# Patient Record
Sex: Male | Born: 1955 | Race: White | Hispanic: No | Marital: Married | Smoking: Former smoker
Health system: Southern US, Community
[De-identification: ages and names within clinical notes are randomized; demographics above are authoritative.]

## PROBLEM LIST (undated history)

## (undated) DIAGNOSIS — R195 Other fecal abnormalities: Secondary | ICD-10-CM

## (undated) DIAGNOSIS — M1990 Unspecified osteoarthritis, unspecified site: Secondary | ICD-10-CM

## (undated) DIAGNOSIS — H8110 Benign paroxysmal vertigo, unspecified ear: Secondary | ICD-10-CM

## (undated) DIAGNOSIS — Z8489 Family history of other specified conditions: Secondary | ICD-10-CM

## (undated) DIAGNOSIS — T7840XA Allergy, unspecified, initial encounter: Secondary | ICD-10-CM

## (undated) DIAGNOSIS — R030 Elevated blood-pressure reading, without diagnosis of hypertension: Secondary | ICD-10-CM

## (undated) DIAGNOSIS — N529 Male erectile dysfunction, unspecified: Secondary | ICD-10-CM

## (undated) DIAGNOSIS — Z9079 Acquired absence of other genital organ(s): Secondary | ICD-10-CM

## (undated) HISTORY — PX: EYE SURGERY: SHX253

## (undated) HISTORY — DX: Gilbert syndrome: E80.4

## (undated) HISTORY — DX: Elevated blood-pressure reading, without diagnosis of hypertension: R03.0

## (undated) HISTORY — DX: Benign paroxysmal vertigo, unspecified ear: H81.10

## (undated) HISTORY — PX: VASECTOMY: SHX75

## (undated) HISTORY — PX: APPENDECTOMY: SHX54

## (undated) HISTORY — DX: Allergy, unspecified, initial encounter: T78.40XA

## (undated) HISTORY — DX: Other fecal abnormalities: R19.5

## (undated) HISTORY — DX: Male erectile dysfunction, unspecified: N52.9

---

## 1978-06-20 HISTORY — PX: TREATMENT FISTULA ANAL: SUR1390

## 2007-04-24 ENCOUNTER — Ambulatory Visit: Payer: Self-pay | Admitting: Gastroenterology

## 2007-04-24 HISTORY — PX: COLONOSCOPY: SHX174

## 2012-09-10 ENCOUNTER — Ambulatory Visit: Payer: BC Managed Care – PPO

## 2012-09-10 ENCOUNTER — Encounter: Payer: Self-pay | Admitting: Family Medicine

## 2012-09-10 ENCOUNTER — Ambulatory Visit (INDEPENDENT_AMBULATORY_CARE_PROVIDER_SITE_OTHER): Payer: BC Managed Care – PPO | Admitting: Family Medicine

## 2012-09-10 VITALS — BP 138/72 | HR 65 | Temp 98.2°F | Resp 16 | Ht 77.0 in | Wt 222.0 lb

## 2012-09-10 DIAGNOSIS — Z0000 Encounter for general adult medical examination without abnormal findings: Secondary | ICD-10-CM

## 2012-09-10 DIAGNOSIS — M25512 Pain in left shoulder: Secondary | ICD-10-CM

## 2012-09-10 DIAGNOSIS — Z8042 Family history of malignant neoplasm of prostate: Secondary | ICD-10-CM | POA: Insufficient documentation

## 2012-09-10 DIAGNOSIS — J309 Allergic rhinitis, unspecified: Secondary | ICD-10-CM

## 2012-09-10 DIAGNOSIS — I781 Nevus, non-neoplastic: Secondary | ICD-10-CM | POA: Insufficient documentation

## 2012-09-10 DIAGNOSIS — M25519 Pain in unspecified shoulder: Secondary | ICD-10-CM | POA: Insufficient documentation

## 2012-09-10 LAB — TSH: TSH: 1.939 u[IU]/mL (ref 0.350–4.500)

## 2012-09-10 LAB — CBC WITH DIFFERENTIAL/PLATELET
Eosinophils Relative: 2 % (ref 0–5)
HCT: 46.5 % (ref 39.0–52.0)
Hemoglobin: 16.6 g/dL (ref 13.0–17.0)
Lymphocytes Relative: 32 % (ref 12–46)
Lymphs Abs: 1.6 10*3/uL (ref 0.7–4.0)
MCV: 84.2 fL (ref 78.0–100.0)
Monocytes Absolute: 0.4 10*3/uL (ref 0.1–1.0)
Monocytes Relative: 8 % (ref 3–12)
RBC: 5.52 MIL/uL (ref 4.22–5.81)
WBC: 5.1 10*3/uL (ref 4.0–10.5)

## 2012-09-10 LAB — POCT URINALYSIS DIPSTICK
Blood, UA: NEGATIVE
Glucose, UA: NEGATIVE
Ketones, UA: NEGATIVE
Spec Grav, UA: 1.01

## 2012-09-10 LAB — COMPREHENSIVE METABOLIC PANEL
ALT: 20 U/L (ref 0–53)
BUN: 20 mg/dL (ref 6–23)
CO2: 28 mEq/L (ref 19–32)
Calcium: 9.9 mg/dL (ref 8.4–10.5)
Chloride: 103 mEq/L (ref 96–112)
Creat: 1.04 mg/dL (ref 0.50–1.35)
Glucose, Bld: 97 mg/dL (ref 70–99)

## 2012-09-10 LAB — LIPID PANEL
Cholesterol: 254 mg/dL — ABNORMAL HIGH (ref 0–200)
VLDL: 16 mg/dL (ref 0–40)

## 2012-09-10 LAB — FOLATE: Folate: >20 ng/mL

## 2012-09-10 IMAGING — CR DG SHOULDER 2+V*L*
2 series · 2 of 2 positions shown · non-contrast
Comparison: None.

CLINICAL DATA: Left shoulder pain.

LEFT SHOULDER - 2+ VIEW

[AP (1 of 2)]
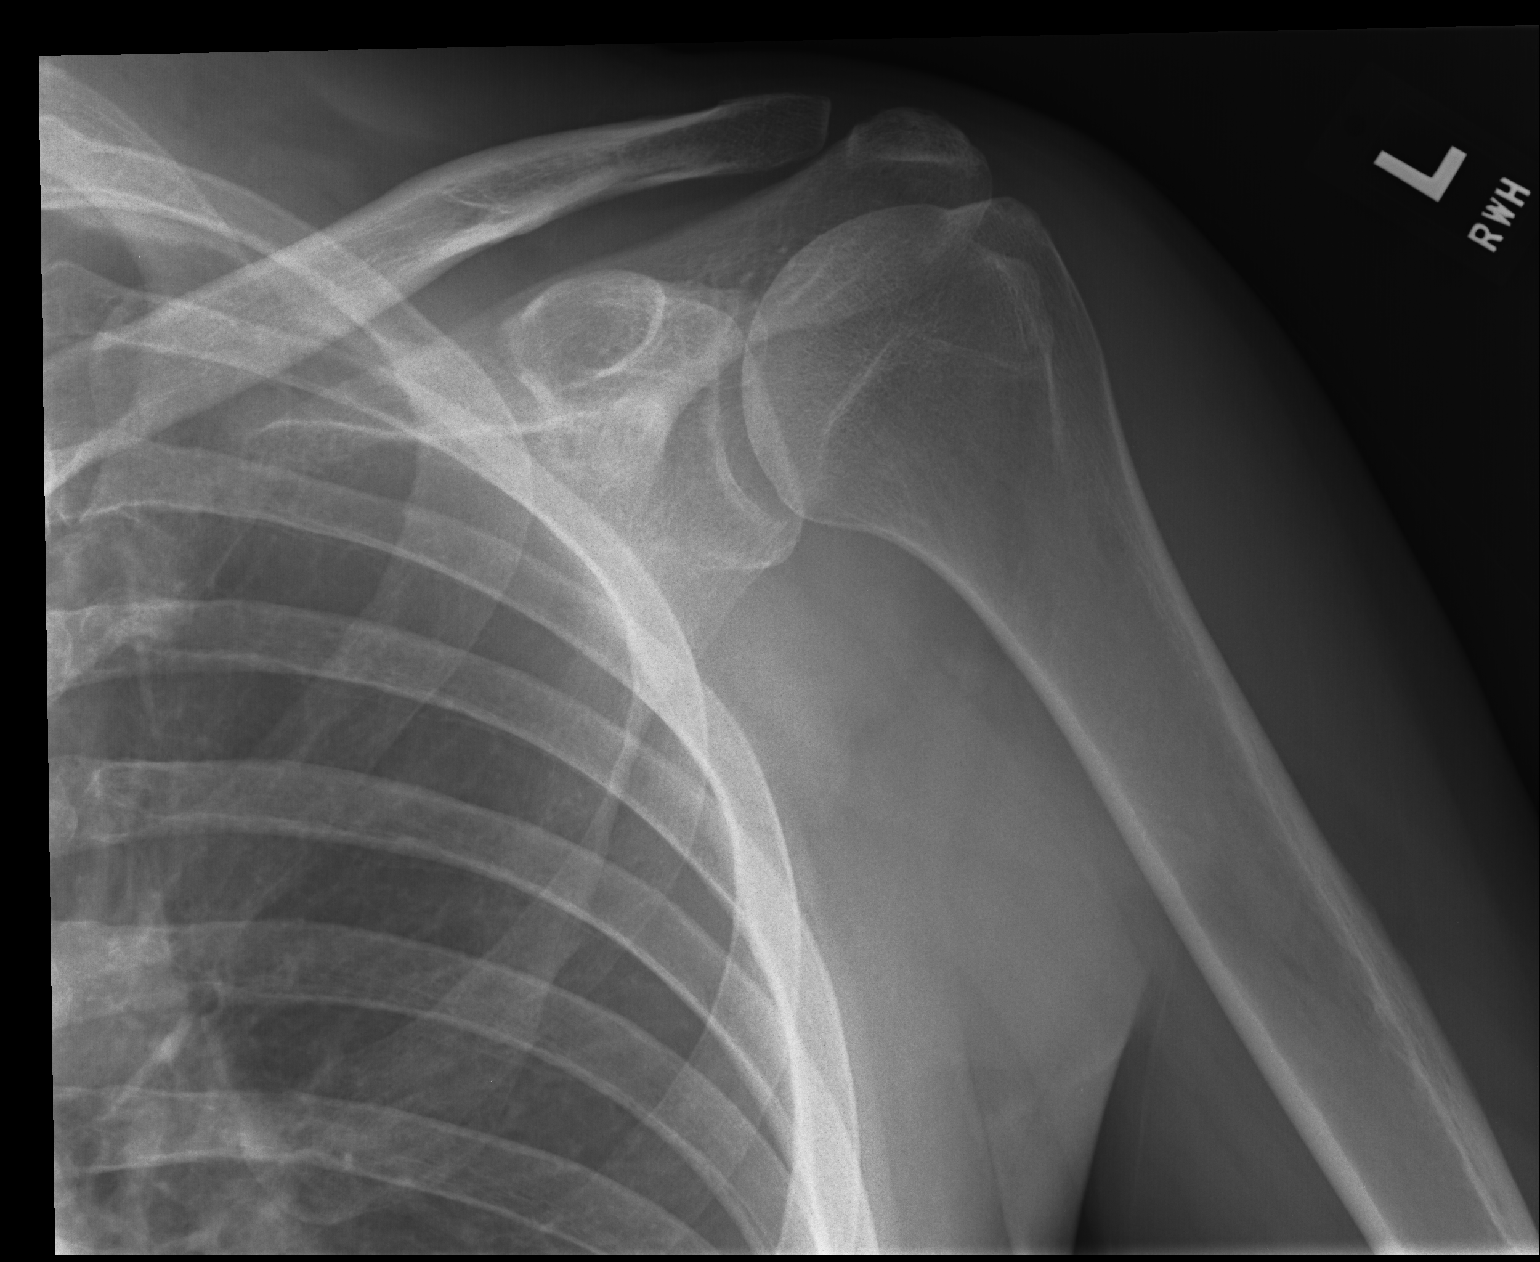

[AP (2 of 2)]
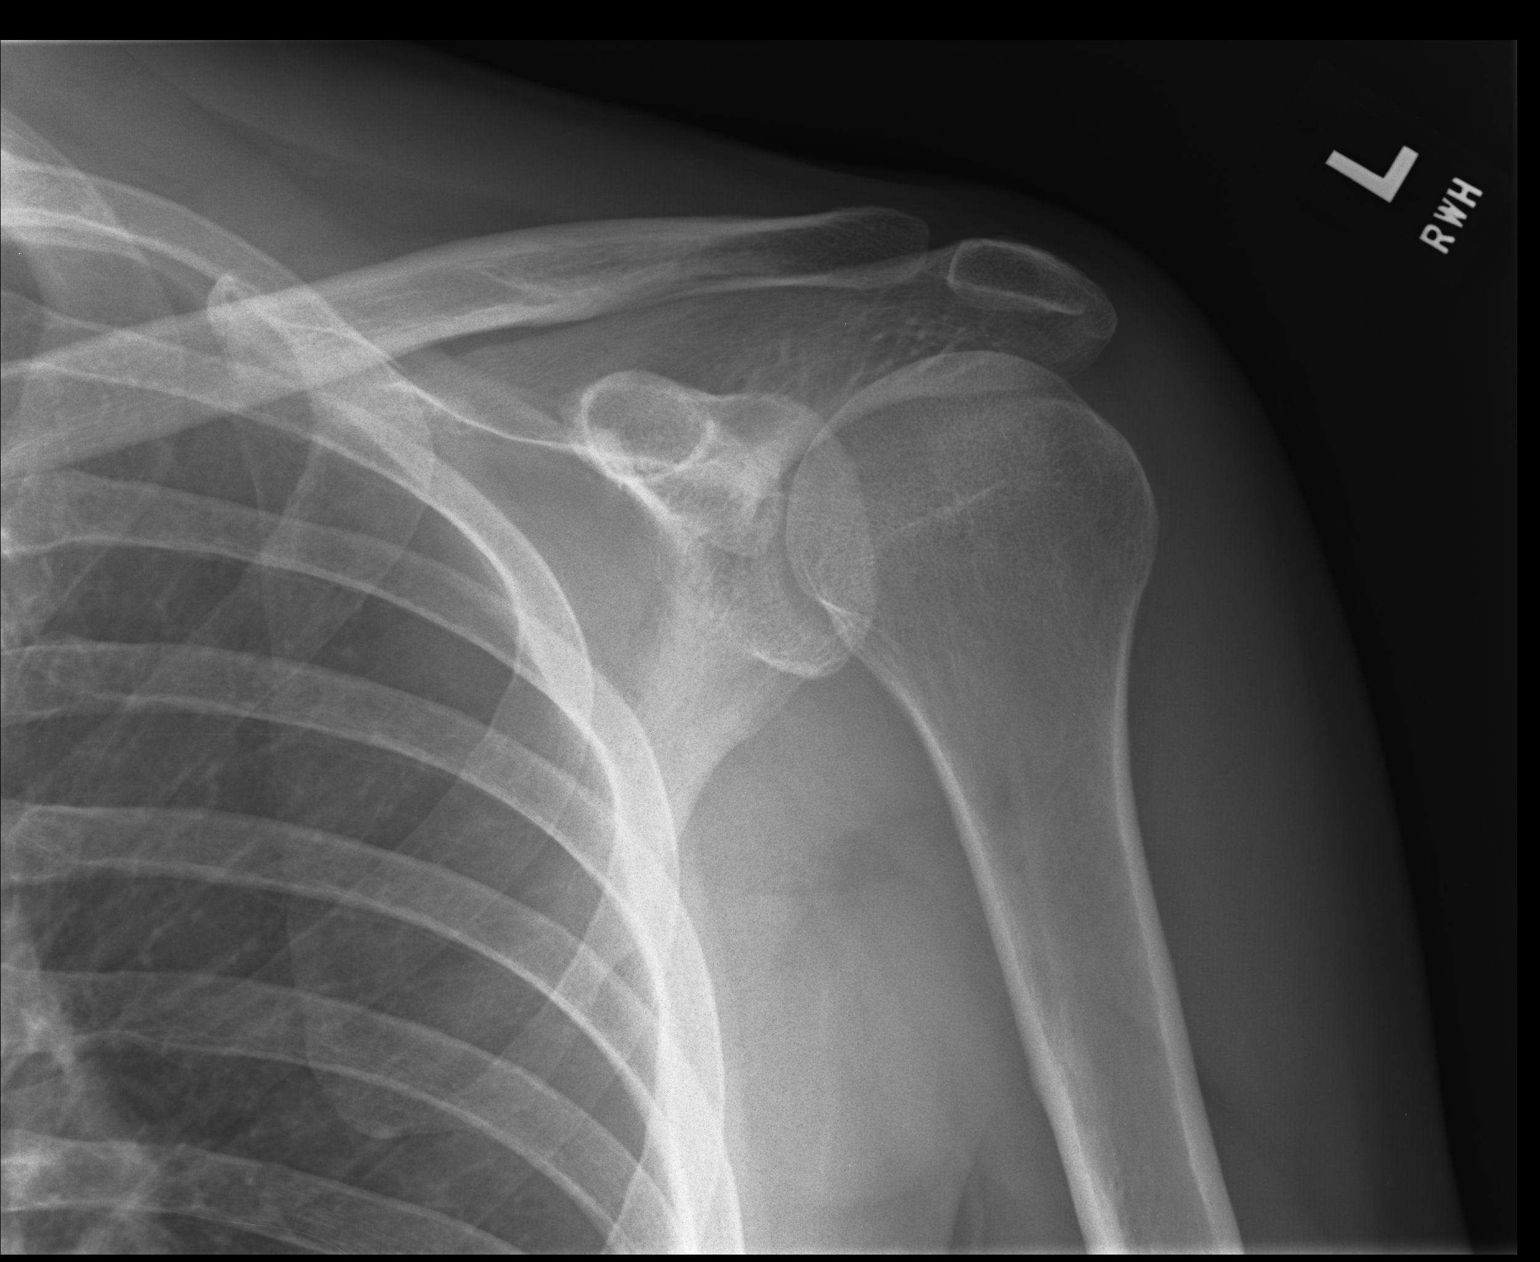

[2 of 2 positions shown; findings below may reference images not displayed]

FINDINGS: No acute bony abnormality.  Specifically, no fracture,
subluxation, or dislocation.  Soft tissues are intact. Joint spaces
are maintained.  Normal bone mineralization.
IMPRESSION: No acute bony abnormality.

## 2012-09-10 MED ORDER — MOMETASONE FUROATE 50 MCG/ACT NA SUSP: 2.0000 | Freq: Every day | NASAL | Status: DC

## 2012-09-10 NOTE — Patient Instructions (Addendum)
Routine general medical examination at a health care facility - Plan: POCT urinalysis dipstick, CBC with Differential, Comprehensive metabolic panel, TSH, Hemoglobin A1c, Lipid panel, Vitamin B12, Folate, Vitamin D 25 hydroxy, EKG 12-Lead, PSA  Pain in shoulder, left - Plan: DG Shoulder Left  Allergic rhinitis   1.  RECOMMEND ASPIRIN 81MG  ONE TABLET DAILY. 2. RECOMMEND DERMATOLOGY EVALUATION IN UPCOMING SIX TO TWELVE MONTHS. 3.  CALL IN ONE MONTH IF SHOULDER PAIN NOT IMPROVED.    Impingement Syndrome, Rotator Cuff, Bursitis with Rehab Impingement syndrome is a condition that involves inflammation of the tendons of the rotator cuff and the subacromial bursa, that causes pain in the shoulder. The rotator cuff consists of four tendons and muscles that control much of the shoulder and upper arm function. The subacromial bursa is a fluid filled sac that helps reduce friction between the rotator cuff and one of the bones of the shoulder (acromion). Impingement syndrome is usually an overuse injury that causes swelling of the bursa (bursitis), swelling of the tendon (tendonitis), and/or a tear of the tendon (strain). Strains are classified into three categories. Grade 1 strains cause pain, but the tendon is not lengthened. Grade 2 strains include a lengthened ligament, due to the ligament being stretched or partially ruptured. With grade 2 strains there is still function, although the function may be decreased. Grade 3 strains include a complete tear of the tendon or muscle, and function is usually impaired. SYMPTOMS   Pain around the shoulder, often at the outer portion of the upper arm.  Pain that gets worse with shoulder function, especially when reaching overhead or lifting.  Sometimes, aching when not using the arm.  Pain that wakes you up at night.  Sometimes, tenderness, swelling, warmth, or redness over the affected area.  Loss of strength.  Limited motion of the shoulder, especially  reaching behind the back (to the back pocket or to unhook bra) or across your body.  Crackling sound (crepitation) when moving the arm.  Biceps tendon pain and inflammation (in the front of the shoulder). Worse when bending the elbow or lifting. CAUSES  Impingement syndrome is often an overuse injury, in which chronic (repetitive) motions cause the tendons or bursa to become inflamed. A strain occurs when a force is paced on the tendon or muscle that is greater than it can withstand. Common mechanisms of injury include: Stress from sudden increase in duration, frequency, or intensity of training.  Direct hit (trauma) to the shoulder.  Aging, erosion of the tendon with normal use.  Bony bump on shoulder (acromial spur). RISK INCREASES WITH:  Contact sports (football, wrestling, boxing).  Throwing sports (baseball, tennis, volleyball).  Weightlifting and bodybuilding.  Heavy labor.  Previous injury to the rotator cuff, including impingement.  Poor shoulder strength and flexibility.  Failure to warm up properly before activity.  Inadequate protective equipment.  Old age.  Bony bump on shoulder (acromial spur). PREVENTION   Warm up and stretch properly before activity.  Allow for adequate recovery between workouts.  Maintain physical fitness:  Strength, flexibility, and endurance.  Cardiovascular fitness.  Learn and use proper exercise technique. PROGNOSIS  If treated properly, impingement syndrome usually goes away within 6 weeks. Sometimes surgery is required.  RELATED COMPLICATIONS   Longer healing time if not properly treated, or if not given enough time to heal.  Recurring symptoms, that result in a chronic condition.  Shoulder stiffness, frozen shoulder, or loss of motion.  Rotator cuff tendon tear.  Recurring symptoms, especially  if activity is resumed too soon, with overuse, with a direct blow, or when using poor technique. TREATMENT  Treatment first  involves the use of ice and medicine, to reduce pain and inflammation. The use of strengthening and stretching exercises may help reduce pain with activity. These exercises may be performed at home or with a therapist. If non-surgical treatment is unsuccessful after more than 6 months, surgery may be advised. After surgery and rehabilitation, activity is usually possible in 3 months.  MEDICATION  If pain medicine is needed, nonsteroidal anti-inflammatory medicines (aspirin and ibuprofen), or other minor pain relievers (acetaminophen), are often advised.  Do not take pain medicine for 7 days before surgery.  Prescription pain relievers may be given, if your caregiver thinks they are needed. Use only as directed and only as much as you need.  Corticosteroid injections may be given by your caregiver. These injections should be reserved for the most serious cases, because they may only be given a certain number of times. HEAT AND COLD  Cold treatment (icing) should be applied for 10 to 15 minutes every 2 to 3 hours for inflammation and pain, and immediately after activity that aggravates your symptoms. Use ice packs or an ice massage.  Heat treatment may be used before performing stretching and strengthening activities prescribed by your caregiver, physical therapist, or athletic trainer. Use a heat pack or a warm water soak. SEEK MEDICAL CARE IF:   Symptoms get worse or do not improve in 4 to 6 weeks, despite treatment.  New, unexplained symptoms develop. (Drugs used in treatment may produce side effects.) EXERCISES  RANGE OF MOTION (ROM) AND STRETCHING EXERCISES - Impingement Syndrome (Rotator Cuff  Tendinitis, Bursitis) These exercises may help you when beginning to rehabilitate your injury. Your symptoms may go away with or without further involvement from your physician, physical therapist or athletic trainer. While completing these exercises, remember:   Restoring tissue flexibility helps  normal motion to return to the joints. This allows healthier, less painful movement and activity.  An effective stretch should be held for at least 30 seconds.  A stretch should never be painful. You should only feel a gentle lengthening or release in the stretched tissue. STRETCH  Flexion, Standing  Stand with good posture. With an underhand grip on your right / left hand, and an overhand grip on the opposite hand, grasp a broomstick or cane so that your hands are a little more than shoulder width apart.  Keeping your right / left elbow straight and shoulder muscles relaxed, push the stick with your opposite hand, to raise your right / left arm in front of your body and then overhead. Raise your arm until you feel a stretch in your right / left shoulder, but before you have increased shoulder pain.  Try to avoid shrugging your right / left shoulder as your arm rises, by keeping your shoulder blade tucked down and toward your mid-back spine. Hold for __________ seconds.  Slowly return to the starting position. Repeat __________ times. Complete this exercise __________ times per day. STRETCH  Abduction, Supine  Lie on your back. With an underhand grip on your right / left hand and an overhand grip on the opposite hand, grasp a broomstick or cane so that your hands are a little more than shoulder width apart.  Keeping your right / left elbow straight and your shoulder muscles relaxed, push the stick with your opposite hand, to raise your right / left arm out to the side  of your body and then overhead. Raise your arm until you feel a stretch in your right / left shoulder, but before you have increased shoulder pain.  Try to avoid shrugging your right / left shoulder as your arm rises, by keeping your shoulder blade tucked down and toward your mid-back spine. Hold for __________ seconds.  Slowly return to the starting position. Repeat __________ times. Complete this exercise __________ times per  day. ROM  Flexion, Active-Assisted  Lie on your back. You may bend your knees for comfort.  Grasp a broomstick or cane so your hands are about shoulder width apart. Your right / left hand should grip the end of the stick, so that your hand is positioned "thumbs-up," as if you were about to shake hands.  Using your healthy arm to lead, raise your right / left arm overhead, until you feel a gentle stretch in your shoulder. Hold for __________ seconds.  Use the stick to assist in returning your right / left arm to its starting position. Repeat __________ times. Complete this exercise __________ times per day.  ROM - Internal Rotation, Supine   Lie on your back on a firm surface. Place your right / left elbow about 60 degrees away from your side. Elevate your elbow with a folded towel, so that the elbow and shoulder are the same height.  Using a broomstick or cane and your strong arm, pull your right / left hand toward your body until you feel a gentle stretch, but no increase in your shoulder pain. Keep your shoulder and elbow in place throughout the exercise.  Hold for __________ seconds. Slowly return to the starting position. Repeat __________ times. Complete this exercise __________ times per day. STRETCH - Internal Rotation  Place your right / left hand behind your back, palm up.  Throw a towel or belt over your opposite shoulder. Grasp the towel with your right / left hand.  While keeping an upright posture, gently pull up on the towel, until you feel a stretch in the front of your right / left shoulder.  Avoid shrugging your right / left shoulder as your arm rises, by keeping your shoulder blade tucked down and toward your mid-back spine.  Hold for __________ seconds. Release the stretch, by lowering your healthy hand. Repeat __________ times. Complete this exercise __________ times per day. ROM - Internal Rotation   Using an underhand grip, grasp a stick behind your back with  both hands.  While standing upright with good posture, slide the stick up your back until you feel a mild stretch in the front of your shoulder.  Hold for __________ seconds. Slowly return to your starting position. Repeat __________ times. Complete this exercise __________ times per day.  STRETCH  Posterior Shoulder Capsule   Stand or sit with good posture. Grasp your right / left elbow and draw it across your chest, keeping it at the same height as your shoulder.  Pull your elbow, so your upper arm comes in closer to your chest. Pull until you feel a gentle stretch in the back of your shoulder.  Hold for __________ seconds. Repeat __________ times. Complete this exercise __________ times per day. STRENGTHENING EXERCISES - Impingement Syndrome (Rotator Cuff Tendinitis, Bursitis) These exercises may help you when beginning to rehabilitate your injury. They may resolve your symptoms with or without further involvement from your physician, physical therapist or athletic trainer. While completing these exercises, remember:  Muscles can gain both the endurance and the strength needed  for everyday activities through controlled exercises.  Complete these exercises as instructed by your physician, physical therapist or athletic trainer. Increase the resistance and repetitions only as guided.  You may experience muscle soreness or fatigue, but the pain or discomfort you are trying to eliminate should never worsen during these exercises. If this pain does get worse, stop and make sure you are following the directions exactly. If the pain is still present after adjustments, discontinue the exercise until you can discuss the trouble with your clinician.  During your recovery, avoid activity or exercises which involve actions that place your injured hand or elbow above your head or behind your back or head. These positions stress the tissues which you are trying to heal. STRENGTH - Scapular Depression  and Adduction   With good posture, sit on a firm chair. Support your arms in front of you, with pillows, arm rests, or on a table top. Have your elbows in line with the sides of your body.  Gently draw your shoulder blades down and toward your mid-back spine. Gradually increase the tension, without tensing the muscles along the top of your shoulders and the back of your neck.  Hold for __________ seconds. Slowly release the tension and relax your muscles completely before starting the next repetition.  After you have practiced this exercise, remove the arm support and complete the exercise in standing as well as sitting position. Repeat __________ times. Complete this exercise __________ times per day.  STRENGTH - Shoulder Abductors, Isometric  With good posture, stand or sit about 4-6 inches from a wall, with your right / left side facing the wall.  Bend your right / left elbow. Gently press your right / left elbow into the wall. Increase the pressure gradually, until you are pressing as hard as you can, without shrugging your shoulder or increasing any shoulder discomfort.  Hold for __________ seconds.  Release the tension slowly. Relax your shoulder muscles completely before you begin the next repetition. Repeat __________ times. Complete this exercise __________ times per day.  STRENGTH - External Rotators, Isometric  Keep your right / left elbow at your side and bend it 90 degrees.  Step into a door frame so that the outside of your right / left wrist can press against the door frame without your upper arm leaving your side.  Gently press your right / left wrist into the door frame, as if you were trying to swing the back of your hand away from your stomach. Gradually increase the tension, until you are pressing as hard as you can, without shrugging your shoulder or increasing any shoulder discomfort.  Hold for __________ seconds.  Release the tension slowly. Relax your shoulder  muscles completely before you begin the next repetition. Repeat __________ times. Complete this exercise __________ times per day.  STRENGTH - Supraspinatus   Stand or sit with good posture. Grasp a __________ weight, or an exercise band or tubing, so that your hand is "thumbs-up," like you are shaking hands.  Slowly lift your right / left arm in a "V" away from your thigh, diagonally into the space between your side and straight ahead. Lift your hand to shoulder height or as far as you can, without increasing any shoulder pain. At first, many people do not lift their hands above shoulder height.  Avoid shrugging your right / left shoulder as your arm rises, by keeping your shoulder blade tucked down and toward your mid-back spine.  Hold for __________ seconds. Control  the descent of your hand, as you slowly return to your starting position. Repeat __________ times. Complete this exercise __________ times per day.  STRENGTH - External Rotators  Secure a rubber exercise band or tubing to a fixed object (table, pole) so that it is at the same height as your right / left elbow when you are standing or sitting on a firm surface.  Stand or sit so that the secured exercise band is at your uninjured side.  Bend your right / left elbow 90 degrees. Place a folded towel or small pillow under your right / left arm, so that your elbow is a few inches away from your side.  Keeping the tension on the exercise band, pull it away from your body, as if pivoting on your elbow. Be sure to keep your body steady, so that the movement is coming only from your rotating shoulder.  Hold for __________ seconds. Release the tension in a controlled manner, as you return to the starting position. Repeat __________ times. Complete this exercise __________ times per day.  STRENGTH - Internal Rotators   Secure a rubber exercise band or tubing to a fixed object (table, pole) so that it is at the same height as your right /  left elbow when you are standing or sitting on a firm surface.  Stand or sit so that the secured exercise band is at your right / left side.  Bend your elbow 90 degrees. Place a folded towel or small pillow under your right / left arm so that your elbow is a few inches away from your side.  Keeping the tension on the exercise band, pull it across your body, toward your stomach. Be sure to keep your body steady, so that the movement is coming only from your rotating shoulder.  Hold for __________ seconds. Release the tension in a controlled manner, as you return to the starting position. Repeat __________ times. Complete this exercise __________ times per day.  STRENGTH - Scapular Protractors, Standing   Stand arms length away from a wall. Place your hands on the wall, keeping your elbows straight.  Begin by dropping your shoulder blades down and toward your mid-back spine.  To strengthen your protractors, keep your shoulder blades down, but slide them forward on your rib cage. It will feel as if you are lifting the back of your rib cage away from the wall. This is a subtle motion and can be challenging to complete. Ask your caregiver for further instruction, if you are not sure you are doing the exercise correctly.  Hold for __________ seconds. Slowly return to the starting position, resting the muscles completely before starting the next repetition. Repeat __________ times. Complete this exercise __________ times per day. STRENGTH - Scapular Protractors, Supine  Lie on your back on a firm surface. Extend your right / left arm straight into the air while holding a __________ weight in your hand.  Keeping your head and back in place, lift your shoulder off the floor.  Hold for __________ seconds. Slowly return to the starting position, and allow your muscles to relax completely before starting the next repetition. Repeat __________ times. Complete this exercise __________ times per  day. STRENGTH - Scapular Protractors, Quadruped  Get onto your hands and knees, with your shoulders directly over your hands (or as close as you can be, comfortably).  Keeping your elbows locked, lift the back of your rib cage up into your shoulder blades, so your mid-back rounds out. Keep  your neck muscles relaxed.  Hold this position for __________ seconds. Slowly return to the starting position and allow your muscles to relax completely before starting the next repetition. Repeat __________ times. Complete this exercise __________ times per day.  STRENGTH - Scapular Retractors  Secure a rubber exercise band or tubing to a fixed object (table, pole), so that it is at the height of your shoulders when you are either standing, or sitting on a firm armless chair.  With a palm down grip, grasp an end of the band in each hand. Straighten your elbows and lift your hands straight in front of you, at shoulder height. Step back, away from the secured end of the band, until it becomes tense.  Squeezing your shoulder blades together, draw your elbows back toward your sides, as you bend them. Keep your upper arms lifted away from your body throughout the exercise.  Hold for __________ seconds. Slowly ease the tension on the band, as you reverse the directions and return to the starting position. Repeat __________ times. Complete this exercise __________ times per day. STRENGTH - Shoulder Extensors   Secure a rubber exercise band or tubing to a fixed object (table, pole) so that it is at the height of your shoulders when you are either standing, or sitting on a firm armless chair.  With a thumbs-up grip, grasp an end of the band in each hand. Straighten your elbows and lift your hands straight in front of you, at shoulder height. Step back, away from the secured end of the band, until it becomes tense.  Squeezing your shoulder blades together, pull your hands down to the sides of your thighs. Do not  allow your hands to go behind you.  Hold for __________ seconds. Slowly ease the tension on the band, as you reverse the directions and return to the starting position. Repeat __________ times. Complete this exercise __________ times per day.  STRENGTH - Scapular Retractors and External Rotators   Secure a rubber exercise band or tubing to a fixed object (table, pole) so that it is at the height as your shoulders, when you are either standing, or sitting on a firm armless chair.  With a palm down grip, grasp an end of the band in each hand. Bend your elbows 90 degrees and lift your elbows to shoulder height, at your sides. Step back, away from the secured end of the band, until it becomes tense.  Squeezing your shoulder blades together, rotate your shoulders so that your upper arms and elbows remain stationary, but your fists travel upward to head height.  Hold for __________ seconds. Slowly ease the tension on the band, as you reverse the directions and return to the starting position. Repeat __________ times. Complete this exercise __________ times per day.  STRENGTH - Scapular Retractors and External Rotators, Rowing   Secure a rubber exercise band or tubing to a fixed object (table, pole) so that it is at the height of your shoulders, when you are either standing, or sitting on a firm armless chair.  With a palm down grip, grasp an end of the band in each hand. Straighten your elbows and lift your hands straight in front of you, at shoulder height. Step back, away from the secured end of the band, until it becomes tense.  Step 1: Squeeze your shoulder blades together. Bending your elbows, draw your hands to your chest, as if you are rowing a boat. At the end of this motion, your hands and  elbow should be at shoulder height and your elbows should be out to your sides.  Step 2: Rotate your shoulders, to raise your hands above your head. Your forearms should be vertical and your upper arms  should be horizontal.  Hold for __________ seconds. Slowly ease the tension on the band, as you reverse the directions and return to the starting position. Repeat __________ times. Complete this exercise __________ times per day.  STRENGTH  Scapular Depressors  Find a sturdy chair without wheels, such as a dining room chair.  Keeping your feet on the floor, and your hands on the chair arms, lift your bottom up from the seat, and lock your elbows.  Keeping your elbows straight, allow gravity to pull your body weight down. Your shoulders will rise toward your ears.  Raise your body against gravity by drawing your shoulder blades down your back, shortening the distance between your shoulders and ears. Although your feet should always maintain contact with the floor, your feet should progressively support less body weight, as you get stronger.  Hold for __________ seconds. In a controlled and slow manner, lower your body weight to begin the next repetition. Repeat __________ times. Complete this exercise __________ times per day.  Document Released: 06/06/2005 Document Revised: 08/29/2011 Document Reviewed: 09/18/2008 [facility] Patient Information 2013 [place], [place].

## 2012-09-10 NOTE — Assessment & Plan Note (Signed)
Stable; refer to dermatology for evaluation.

## 2012-09-10 NOTE — Progress Notes (Signed)
[address]   [place], [place]  [address]   [phone]  Subjective:    Patient ID: Ian Campos, male    DOB: 12-13-1955, 57 y.o.   MRN: [id number]  HPI This 57 y.o. male presents to establish care and for CPE.    Last physical 2008. Last Tetanus 2007. Flu vaccine never. Colonoscopy 2008; normal; repeat in 10 years; [facility]. Eye exam 2000; s/p Lasik.  Readers as needed. Dental exam every six months.  L shoulder pain: onset nine months ago; no injury; pain with elevation above head and with reaching behind to the back.  No neck pain; no radiation into arm.  Range of motion slightly improving.  Decreased ROM.   Review of Systems  Constitutional: Negative for fever, chills, diaphoresis, activity change, appetite change, fatigue and unexpected weight change.  HENT: Negative for hearing loss, ear pain, nosebleeds, congestion, sore throat, facial swelling, rhinorrhea, sneezing, drooling, mouth sores, trouble swallowing, neck pain, neck stiffness, dental problem, voice change, postnasal drip, sinus pressure, tinnitus and ear discharge.   Eyes: Negative for photophobia, pain, discharge, redness, itching and visual disturbance.  Respiratory: Negative for apnea, cough, choking, chest tightness, shortness of breath, wheezing and stridor.   Cardiovascular: Negative for chest pain, palpitations and leg swelling.  Gastrointestinal: Negative for nausea, vomiting, abdominal pain, diarrhea, constipation, blood in stool, abdominal distention, anal bleeding and rectal pain.  Endocrine: Negative for cold intolerance, heat intolerance, polydipsia, polyphagia and polyuria.  Genitourinary: Negative for dysuria, urgency, frequency, hematuria, flank pain, decreased urine volume, discharge, penile swelling, scrotal swelling, enuresis, difficulty urinating, genital sores, penile pain and testicular pain.  Musculoskeletal: Positive for myalgias and arthralgias. Negative for back pain, joint swelling and gait  problem.  Skin: Negative for color change, pallor, rash and wound.  Allergic/Immunologic: Negative for environmental allergies, food allergies and immunocompromised state.  Neurological: Negative for dizziness, tremors, seizures, syncope, facial asymmetry, speech difficulty, weakness, light-headedness, numbness and headaches.  Hematological: Negative for adenopathy. Does not bruise/bleed easily.  Psychiatric/Behavioral: Negative for suicidal ideas, hallucinations, behavioral problems, confusion, sleep disturbance, self-injury, dysphoric mood, decreased concentration and agitation. The patient is not nervous/anxious and is not hyperactive.         Past Medical History  Diagnosis Date  . Gilbert's disease   . Allergy     Nasonex    Past Surgical History  Procedure Laterality Date  . Treatment fistula anal  06/20/1978  . Vasectomy    . Eye surgery      Lasik  . Colonoscopy  04/24/2007    normal.  [facility].    Prior to Admission medications   Medication Sig Start Date End Date Taking? Authorizing Provider  fish oil-omega-3 fatty acids 1000 MG capsule Take 2 g by mouth daily.   Yes Historical Provider, MD  mometasone (NASONEX) 50 MCG/ACT nasal spray Place 2 sprays into the nose daily. 09/10/12  Yes Ethelda Chick, MD  Multiple Vitamin (MULTIVITAMIN) tablet Take 1 tablet by mouth daily.   Yes Historical Provider, MD    Allergies  Allergen Reactions  . Penicillins     History   Social History  . Marital Status: Married    Spouse Name: N/A    Number of Children: N/A  . Years of Education: N/A   Occupational History  . Not on file.   Social History Main Topics  . Smoking status: Former Games developer  . Smokeless tobacco: Not on file  . Alcohol Use: Yes     Comment: social daily  .  Drug Use: No  . Sexually Active: Yes   Other Topics Concern  . Not on file   Social History Narrative   Marital status:  Married x 38 years.      Children:  1 child Casimiro Needle), 2 grandchildren.       Lives: with wife.        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      Tobacco:  Stopped age 89.      Alcohol: 3-5 beers per night; more on weekends.  No DWIs.      Drugs:  None      Exercise:  Sporadic; cuts grass duirng summer; walks with wife.      Seatbelt: 100%.      Sunscreen:  SPF 15.        Guns:  Several; locked/secured.      Family History  Problem Relation Age of Onset  . Stroke Mother 71  . Heart disease Mother 41    CABG age 74  . Hyperlipidemia Brother   . Hypertension Brother   . Heart disease Brother   . Hyperlipidemia Brother   . Hypertension Brother     Objective:   Physical Exam  Nursing note and vitals reviewed. Constitutional: He is oriented to person, place, and time. He appears well-developed and well-nourished. No distress.  HENT:  Head: Normocephalic and atraumatic.  Right Ear: External ear normal.  Left Ear: External ear normal.  Nose: Nose normal.  Mouth/Throat: Oropharynx is clear and moist.  Eyes: Conjunctivae and EOM are normal. Pupils are equal, round, and reactive to light.  Neck: Normal range of motion. Neck supple. No JVD present. No thyromegaly present.  Cardiovascular: Normal rate, regular rhythm, normal heart sounds and intact distal pulses.  Exam reveals no gallop and no friction rub.   No murmur heard. Pulmonary/Chest: Effort normal and breath sounds normal. No respiratory distress. He has no wheezes. He has no rales.  Abdominal: Soft. Bowel sounds are normal. He exhibits no distension and no mass. There is no tenderness. There is no rebound and no guarding. Hernia confirmed negative in the right inguinal area and confirmed negative in the left inguinal area.  Genitourinary: Testes normal and penis normal.  Patient refused digital rectal exam.  Musculoskeletal:       Right shoulder: Normal.       Left shoulder: He exhibits decreased range of motion and pain. He exhibits no tenderness, no  bony tenderness, no spasm, normal pulse and normal strength.       Cervical back: Normal.  L SHOULDER: PAIN AT 90 DEGREES ELEVATION OF L SHOULDER; EMPTY CAN SIGN NEGATIVE; CROSS OVER NEGATIVE.  DIFFICULTY PASSIVELY AND ACTIVELY ELEVATING SHOULDER>90 DEGREES.  Lymphadenopathy:    He has no cervical adenopathy.       Right: No inguinal adenopathy present.       Left: No inguinal adenopathy present.  Neurological: He is alert and oriented to person, place, and time. He has normal reflexes. No cranial nerve deficit. He exhibits normal muscle tone. Coordination normal.  Skin: No rash noted. He is not diaphoretic. No erythema. No pallor.  MULTIPLE SCATTERED NEVI BACK.  Psychiatric: He has a normal mood and affect. His behavior is normal. Judgment and thought content normal.    EKG: NSR.       [facility] reading (PRIMARY) by  Dr. Katrinka Blazing.  L SHOULDER: NAD.  Assessment & Plan:  Routine general medical examination at a  health care facility - Plan: POCT urinalysis dipstick, CBC with Differential, Comprehensive metabolic panel, TSH, Hemoglobin A1c, Lipid panel, Vitamin B12, Folate, Vitamin D 25 hydroxy, EKG 12-Lead, PSA  Pain in shoulder, left - Plan: DG Shoulder Left  Allergic rhinitis  Nevus, non-neoplastic - Plan: Ambulatory referral to Dermatology

## 2012-09-10 NOTE — Assessment & Plan Note (Signed)
New.  Obtain xray; provided with rotator cuff strain home exercises to perform daily. If no improvement in one month, to call for ortho referral.

## 2012-09-10 NOTE — Assessment & Plan Note (Signed)
Stable; refused prostate exam in office; agreeable to PSA. Asymptomatic.

## 2012-09-10 NOTE — Assessment & Plan Note (Signed)
Stable; refill of Nasonex provided.

## 2012-09-10 NOTE — Assessment & Plan Note (Signed)
Anticipatory guidance --- ASA 81 mg daily, exercise daily.  Immunizations reviewed; refuses flu vaccines.  Colonoscopy UTD.  Obtain labs.  Refused prostate exam.

## 2012-09-11 ENCOUNTER — Encounter: Payer: Self-pay | Admitting: *Deleted

## 2012-09-11 LAB — VITAMIN D 25 HYDROXY (VIT D DEFICIENCY, FRACTURES): Vit D, 25-Hydroxy: 50 ng/mL (ref 30–89)

## 2013-04-25 ENCOUNTER — Other Ambulatory Visit: Payer: Self-pay

## 2015-10-12 ENCOUNTER — Ambulatory Visit (INDEPENDENT_AMBULATORY_CARE_PROVIDER_SITE_OTHER): Payer: BLUE CROSS/BLUE SHIELD

## 2015-10-12 ENCOUNTER — Ambulatory Visit (INDEPENDENT_AMBULATORY_CARE_PROVIDER_SITE_OTHER): Payer: BLUE CROSS/BLUE SHIELD | Admitting: Family Medicine

## 2015-10-12 VITALS — BP 144/78 | HR 97 | Temp 98.1°F | Resp 18 | Wt 227.0 lb

## 2015-10-12 DIAGNOSIS — M542 Cervicalgia: Secondary | ICD-10-CM | POA: Diagnosis not present

## 2015-10-12 DIAGNOSIS — S199XXA Unspecified injury of neck, initial encounter: Secondary | ICD-10-CM | POA: Diagnosis not present

## 2015-10-12 DIAGNOSIS — M5030 Other cervical disc degeneration, unspecified cervical region: Secondary | ICD-10-CM

## 2015-10-12 DIAGNOSIS — S161XXA Strain of muscle, fascia and tendon at neck level, initial encounter: Secondary | ICD-10-CM

## 2015-10-12 DIAGNOSIS — Z23 Encounter for immunization: Secondary | ICD-10-CM | POA: Diagnosis not present

## 2015-10-12 MED ORDER — ZOSTER VACCINE LIVE 19400 UNT/0.65ML ~~LOC~~ SOLR: 0.6500 mL | Freq: Once | SUBCUTANEOUS | Status: DC

## 2015-10-12 MED ORDER — METHOCARBAMOL 500 MG PO TABS: 500.0000 mg | ORAL_TABLET | Freq: Every day | ORAL | Status: DC

## 2015-10-12 MED ORDER — MELOXICAM 15 MG PO TABS: 15.0000 mg | ORAL_TABLET | Freq: Every day | ORAL | Status: DC

## 2015-10-12 NOTE — Patient Instructions (Signed)
     IF you received an x-ray today, you will receive an invoice from [facility]. Please contact [facility] at [phone] with questions or concerns regarding your invoice.   IF you received labwork today, you will receive an invoice from [facility]. Please contact [facility] at [phone] with questions or concerns regarding your invoice.   Our billing staff will not be able to assist you with questions regarding bills from these companies.  You will be contacted with the lab results as soon as they are available. The fastest way to get your results is to activate your My Chart account. Instructions are located on the last page of this paperwork. If you have not heard from us regarding the results in 2 weeks, please contact this office.      

## 2015-10-12 NOTE — Progress Notes (Signed)
Subjective:    Patient ID: Ian Campos, male    DOB: 09-Dec-1955, 60 y.o.   MRN: [id number]  10/12/2015  Neck Pain   HPI This 60 y.o. male presents for evaluation of neck pain.  Onset for six months; at friend's house; leaned back against a railing and fell back onto boat lift; fell three feet; did not spill beer.  [address] more on buttocks and caugth self on elbows; then started tightening up; decreased range of motion; wakes self up.  If applies pressure to spine, takes away pain and can turn neck.  Has friend that has surgery on neck and blew a disc.No radiation; shooting pains up back of neck into head.  Numbness in occpital region.  No weakness.  Some headaches occipital and parietal region.  Taking Aleve with moderate severity with some improvement; only takes when necessary.  No heat or ice.   No other neck pain.  No lower back issues.  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  No overhead work.    Exposure to shingles: wife currently with active shingles infection; interested in vaccination.   Review of Systems  Constitutional: Negative for fever, chills, diaphoresis, activity change, appetite change and fatigue.  Respiratory: Negative for cough and shortness of breath.   Cardiovascular: Negative for chest pain, palpitations and leg swelling.  Gastrointestinal: Negative for nausea, vomiting, abdominal pain and diarrhea.  Endocrine: Negative for cold intolerance, heat intolerance, polydipsia, polyphagia and polyuria.  Musculoskeletal: Positive for neck pain and neck stiffness.  Skin: Negative for color change, rash and wound.  Neurological: Negative for dizziness, tremors, seizures, syncope, facial asymmetry, speech difficulty, weakness, light-headedness, numbness and headaches.  Psychiatric/Behavioral: Negative for sleep disturbance and dysphoric mood. The patient is not nervous/anxious.     Past Medical History  Diagnosis Date  . Gilbert's disease   .  Allergy     Nasonex  . Erectile dysfunction   . Nonspecific abnormal finding in stool contents   . Elevated blood pressure reading without diagnosis of hypertension   . Benign paroxysmal positional vertigo    Past Surgical History  Procedure Laterality Date  . Treatment fistula anal  06/20/1978  . Vasectomy    . Eye surgery      Lasik  . Colonoscopy  04/24/2007    normal.  [facility].   Allergies  Allergen Reactions  . Penicillins     Social History   Social History  . Marital Status: Married    Spouse Name: N/A  . Number of Children: 1  . Years of Education: 12   Occupational History  . HH [employer].     fulltime x 18 years   Social History Main Topics  . Smoking status: Former Smoker -- 0.50 packs/day    Types: Cigarettes  . Smokeless tobacco: Not on file  . Alcohol Use: Yes     Comment: social daily  . Drug Use: No  . Sexual Activity: Yes   Other Topics Concern  . Not on file   Social History Narrative   Marital status:  Married x 38 years.      Children:  1 child Legrand Como), 2 grandchildren.      Lives: with wife.        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      Tobacco:  Stopped age 45.      Alcohol: 3-5 beers per night;  more on weekends.  No DWIs.      Drugs:  None      Exercise:  Sporadic; cuts grass duirng summer; walks with wife.      Seatbelt: 100%.      Sunscreen:  SPF 15.        Guns:  Several; locked/secured.        Caffeine use: Consumes moderate amount daily.      Smoke alarm in the home.         Family History  Problem Relation Age of Onset  . Stroke Mother 46  . Heart disease Mother 59    CABG age 2  . Hypertension Mother   . Hyperlipidemia Brother   . Hypertension Brother   . Heart disease Brother   . Hyperlipidemia Brother   . Hypertension Brother   . Cancer Brother 85    Prostate cancer  . Mental illness Father   . Heart disease Maternal Grandmother   . Emphysema Brother         Objective:    BP 144/78 mmHg  Pulse 97  Temp(Src) 98.1 F (36.7 C) (Oral)  Resp 18  Wt 227 lb (102.967 kg)  SpO2 97% Physical Exam  Constitutional: He is oriented to person, place, and time. He appears well-developed and well-nourished. No distress.  HENT:  Head: Normocephalic and atraumatic.  Eyes: Conjunctivae and EOM are normal. Pupils are equal, round, and reactive to light.  Neck: Normal range of motion. Neck supple. Carotid bruit is not present. No thyromegaly present.  Cardiovascular: Normal rate, regular rhythm, normal heart sounds and intact distal pulses.  Exam reveals no gallop and no friction rub.   No murmur heard. Pulmonary/Chest: Effort normal and breath sounds normal. He has no wheezes. He has no rales.  Musculoskeletal:       Cervical back: He exhibits decreased range of motion and pain. He exhibits no tenderness, no bony tenderness, no swelling, no spasm and normal pulse.  Cervical spine: non-tender midline; non-tender paraspinal regions B; decreased ROM cervical spine with limitation to the L.  Motor 5/5 BUE.  Grip 5/5.   Lymphadenopathy:    He has no cervical adenopathy.  Neurological: He is alert and oriented to person, place, and time. No cranial nerve deficit.  Skin: Skin is warm and dry. No rash noted. He is not diaphoretic.  Psychiatric: He has a normal mood and affect. His behavior is normal.  Nursing note and vitals reviewed.  Results for orders placed or performed in visit on 09/11/12  HM COLONOSCOPY  Result Value Ref Range   HM Colonoscopy normal  [facility]    Dg Cervical Spine Complete  10/12/2015  CLINICAL DATA:  Neck pain for 6 months after falling. Initial encounter. EXAM: CERVICAL SPINE - COMPLETE 4+ VIEW COMPARISON:  None. FINDINGS: The prevertebral soft tissues are normal. The alignment is anatomic through T1. There is no evidence of acute fracture or traumatic subluxation. The C1-2 articulation appears normal in the AP projection. There are  degenerative changes throughout the cervical spine with disc space loss and uncinate spurring greatest at C5-6 and C6-7. There are scattered facet degenerative changes. No high-grade foraminal narrowing identified. IMPRESSION: No acute osseous findings or malalignment.  Moderate spondylosis. Electronically Signed   By: Richardean Sale M.D.   On: 10/12/2015 17:31      Assessment & Plan:   1. Neck pain   2. Neck strain, initial encounter   3. Degenerative disc disease, cervical   4. Need  for Zostavax administration    -New. -Rx for Mobic, Robaxin provided. -continue home exercise program. -if no improvement in two weeks, call for ortho referral. -rx for Zostavax provided.  Orders Placed This Encounter  Procedures  . DG Cervical Spine Complete    Standing Status: Future     Number of Occurrences: 1     Standing Expiration Date: 10/11/2016    Order Specific Question:  Reason for Exam (SYMPTOM  OR DIAGNOSIS REQUIRED)    Answer:  neck pain for six months after falling backwards    Order Specific Question:  Preferred imaging location?    Answer:  External   Meds ordered this encounter  Medications  . zoster vaccine live, PF, (ZOSTAVAX) [address] UNT/0.65ML injection    Sig: Inject 19,400 Units into the skin once.    Dispense:  1 each    Refill:  0  . methocarbamol (ROBAXIN) 500 MG tablet    Sig: Take 1-2 tablets (500-1,000 mg total) by mouth at bedtime.    Dispense:  60 tablet    Refill:  0  . meloxicam (MOBIC) 15 MG tablet    Sig: Take 1 tablet (15 mg total) by mouth daily.    Dispense:  30 tablet    Refill:  0    No Follow-up on file.    Tzipora Mcinroy Elayne Guerin, M.D. [facility] [address] [place], [place]  [address] [phone] phone [phone] fax

## 2015-11-08 ENCOUNTER — Other Ambulatory Visit: Payer: Self-pay | Admitting: Family Medicine

## 2016-08-24 ENCOUNTER — Ambulatory Visit (INDEPENDENT_AMBULATORY_CARE_PROVIDER_SITE_OTHER): Payer: BLUE CROSS/BLUE SHIELD | Admitting: Family Medicine

## 2016-08-24 ENCOUNTER — Encounter: Payer: Self-pay | Admitting: Family Medicine

## 2016-08-24 VITALS — BP 141/78 | HR 64 | Temp 99.0°F | Resp 16 | Ht 77.0 in | Wt 227.0 lb

## 2016-08-24 DIAGNOSIS — Z131 Encounter for screening for diabetes mellitus: Secondary | ICD-10-CM | POA: Diagnosis not present

## 2016-08-24 DIAGNOSIS — Z136 Encounter for screening for cardiovascular disorders: Secondary | ICD-10-CM | POA: Diagnosis not present

## 2016-08-24 DIAGNOSIS — Z0000 Encounter for general adult medical examination without abnormal findings: Secondary | ICD-10-CM | POA: Diagnosis not present

## 2016-08-24 DIAGNOSIS — Z6826 Body mass index (BMI) 26.0-26.9, adult: Secondary | ICD-10-CM

## 2016-08-24 DIAGNOSIS — E7800 Pure hypercholesterolemia, unspecified: Secondary | ICD-10-CM | POA: Diagnosis not present

## 2016-08-24 DIAGNOSIS — J301 Allergic rhinitis due to pollen: Secondary | ICD-10-CM

## 2016-08-24 DIAGNOSIS — Z1329 Encounter for screening for other suspected endocrine disorder: Secondary | ICD-10-CM

## 2016-08-24 DIAGNOSIS — Z125 Encounter for screening for malignant neoplasm of prostate: Secondary | ICD-10-CM | POA: Diagnosis not present

## 2016-08-24 DIAGNOSIS — Z8042 Family history of malignant neoplasm of prostate: Secondary | ICD-10-CM

## 2016-08-24 DIAGNOSIS — Z1211 Encounter for screening for malignant neoplasm of colon: Secondary | ICD-10-CM | POA: Diagnosis not present

## 2016-08-24 DIAGNOSIS — I781 Nevus, non-neoplastic: Secondary | ICD-10-CM

## 2016-08-24 LAB — POCT URINALYSIS DIP (MANUAL ENTRY)
BILIRUBIN UA: NEGATIVE
Bilirubin, UA: NEGATIVE
Glucose, UA: NEGATIVE
LEUKOCYTES UA: NEGATIVE
NITRITE UA: NEGATIVE
PH UA: 7
PROTEIN UA: NEGATIVE
RBC UA: NEGATIVE
Spec Grav, UA: 1.01
Urobilinogen, UA: 0.2

## 2016-08-24 NOTE — Patient Instructions (Addendum)
   IF you received an x-ray today, you will receive an invoice from [facility]. Please contact [facility] at [phone] with questions or concerns regarding your invoice.   IF you received labwork today, you will receive an invoice from [facility]. Please contact [facility] at [phone] with questions or concerns regarding your invoice.   Our billing staff will not be able to assist you with questions regarding bills from these companies.  You will be contacted with the lab results as soon as they are available. The fastest way to get your results is to activate your My Chart account. Instructions are located on the last page of this paperwork. If you have not heard from us regarding the results in 2 weeks, please contact this office.     Keeping you healthy  Get these tests  Blood pressure- Have your blood pressure checked once a year by your healthcare provider.  Normal blood pressure is 120/80  Weight- Have your body mass index (BMI) calculated to screen for obesity.  BMI is a measure of body fat based on height and weight. You can also calculate your own BMI at www.[facility]hl[facility].com/bmi/.  Cholesterol- Have your cholesterol checked every year.  Diabetes- Have your blood sugar checked regularly if you have high blood pressure, high cholesterol, have a family history of diabetes or if you are overweight.  Screening for Colon Cancer- Colonoscopy starting at age 50.  Screening may begin sooner depending on your family history and other health conditions. Follow up colonoscopy as directed by your Gastroenterologist.  Screening for Prostate Cancer- Both blood work (PSA) and a rectal exam help screen for Prostate Cancer.  Screening begins at age 40 with African-American men and at age 50 with Caucasian men.  Screening may begin sooner depending on your family history.  Take these medicines  Aspirin- One aspirin daily can help prevent Heart disease and Stroke.  Flu  shot- Every fall.  Tetanus- Every 10 years.  Zostavax- Once after the age of 60 to prevent Shingles.  Pneumonia shot- Once after the age of 65; if you are younger than 65, ask your healthcare provider if you need a Pneumonia shot.  Take these steps  Don't smoke- If you do smoke, talk to your doctor about quitting.  For tips on how to quit, go to www.smokefree.gov or call 1-800-QUIT-NOW.  Be physically active- Exercise 5 days a week for at least 30 minutes.  If you are not already physically active start slow and gradually work up to 30 minutes of moderate physical activity.  Examples of moderate activity include walking briskly, mowing the yard, dancing, swimming, bicycling, etc.  Eat a healthy diet- Eat a variety of healthy food such as fruits, vegetables, low fat milk, low fat cheese, yogurt, lean meant, poultry, fish, beans, tofu, etc. For more information go to www.thenutritionsource.org  Drink alcohol in moderation- Limit alcohol intake to less than two drinks a day. Never drink and drive.  Dentist- Brush and floss twice daily; visit your dentist twice a year.  Depression- Your emotional health is as important as your physical health. If you're feeling down, or losing interest in things you would normally enjoy please talk to your healthcare provider.  Eye exam- Visit your eye doctor every year.  Safe sex- If you may be exposed to a sexually transmitted infection, use a condom.  Seat belts- Seat belts can save your life; always wear one.  Smoke/Carbon Monoxide detectors- These detectors need to be installed on the appropriate   level of your home.  Replace batteries at least once a year.  Skin cancer- When out in the sun, cover up and use sunscreen 15 SPF or higher.  Violence- If anyone is threatening you, please tell your healthcare provider.  Living Will/ Health care power of attorney- Speak with your healthcare provider and family. 

## 2016-08-24 NOTE — Progress Notes (Signed)
Subjective:    Patient ID: Ian Campos, male    DOB: 1956/06/11, 61 y.o.   MRN: [id number]  08/24/2016  Annual Exam   HPI This 61 y.o. male presents for Routine Physical Examination.  Last physical: 09-10-2012 Colonoscopy:  2008 Wohl PSA:   Eye exam:  Due; had Lasik 15 years ago.  Dental exam:  Every six months.   Visual Acuity Screening   Right eye Left eye Both eyes  Without correction: 20/15 20/13 20/13   With correction:       Immunization History  Administered Date(s) Administered  . Influenza-Unspecified 07/16/2016   BP Readings from Last 3 Encounters:  08/24/16 (!) 141/78  10/12/15 (!) 144/78  03/30/10 138/76   Wt Readings from Last 3 Encounters:  08/24/16 227 lb (103 kg)  10/12/15 227 lb (103 kg)  03/30/10 221 lb (100.2 kg)    Review of Systems  Constitutional: Negative for activity change, appetite change, chills, diaphoresis, fatigue, fever and unexpected weight change.  HENT: Negative for congestion, dental problem, drooling, ear discharge, ear pain, facial swelling, hearing loss, mouth sores, nosebleeds, postnasal drip, rhinorrhea, sinus pressure, sneezing, sore throat, tinnitus, trouble swallowing and voice change.   Eyes: Negative for photophobia, pain, discharge, redness, itching and visual disturbance.  Respiratory: Negative for apnea, cough, choking, chest tightness, shortness of breath, wheezing and stridor.   Cardiovascular: Negative for chest pain, palpitations and leg swelling.  Gastrointestinal: Negative for abdominal pain, blood in stool, constipation, diarrhea, nausea and vomiting.  Endocrine: Negative for cold intolerance, heat intolerance, polydipsia, polyphagia and polyuria.  Genitourinary: Negative for decreased urine volume, difficulty urinating, discharge, dysuria, enuresis, flank pain, frequency, genital sores, hematuria, penile pain, penile swelling, scrotal swelling, testicular pain and urgency.  Musculoskeletal: Negative for  arthralgias, back pain, gait problem, joint swelling, myalgias, neck pain and neck stiffness.  Skin: Negative for color change, pallor, rash and wound.  Allergic/Immunologic: Negative for environmental allergies, food allergies and immunocompromised state.  Neurological: Negative for dizziness, tremors, seizures, syncope, facial asymmetry, speech difficulty, weakness, light-headedness, numbness and headaches.  Hematological: Negative for adenopathy. Does not bruise/bleed easily.  Psychiatric/Behavioral: Negative for agitation, behavioral problems, confusion, decreased concentration, dysphoric mood, hallucinations, self-injury, sleep disturbance and suicidal ideas. The patient is not nervous/anxious and is not hyperactive.        Bedtime 11:00pm; wakes up at 7:00am.    Past Medical History:  Diagnosis Date  . Allergy    Nasonex  . Benign paroxysmal positional vertigo   . Elevated blood pressure reading without diagnosis of hypertension   . Erectile dysfunction   . Gilbert's disease   . Nonspecific abnormal finding in stool contents    Past Surgical History:  Procedure Laterality Date  . COLONOSCOPY  04/24/2007   normal.  Wohl.  . EYE SURGERY     Lasik  . TREATMENT FISTULA ANAL  06/20/1978  . VASECTOMY     Allergies  Allergen Reactions  . Penicillins     Social History   Social History  . Marital status: Married    Spouse name: N/A  . Number of children: 1  . Years of education: 80   Occupational History  . [employer].     fulltime x 18 years   Social History Main Topics  . Smoking status: Former Smoker    Packs/day: 0.50    Types: Cigarettes  . Smokeless tobacco: Never Used  . Alcohol use Yes     Comment: social daily  . Drug use:  No  . Sexual activity: Yes   Other Topics Concern  . Not on file   Social History Narrative   Marital status:  Married x 42 years.      Children:  1 child Legrand Como), 2 grandchildren.      Lives: with wife.        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  Travels a lot.      Tobacco:  Stopped age 54.      Alcohol: 3-8 beers per night; more on weekends.  No DWIs.      Drugs:  None      Exercise:  none      Seatbelt: 100%.      Sunscreen:  SPF 15.        Guns:  Several; locked/secured.        Caffeine use: Consumes moderate amount daily.      Smoke alarm in the home.         Family History  Problem Relation Age of Onset  . Stroke Mother 46  . Heart disease Mother 31    CABG age 56  . Hypertension Mother   . Hyperlipidemia Brother   . Hypertension Brother   . Heart disease Brother 58    AMI  . Heart disease Brother   . Hyperlipidemia Brother   . Hypertension Brother   . COPD Brother   . Mental illness Father   . Cancer Brother     prostate cancer  . Heart disease Maternal Grandmother   . Heart disease Maternal Grandfather   . Hyperlipidemia Maternal Grandfather        Objective:    BP (!) 141/78   Pulse 64   Temp 99 F (37.2 C) (Oral)   Resp 16   Ht 6\' 5"  (1.956 m)   Wt 227 lb (103 kg)   SpO2 100%   BMI 26.92 kg/m  Physical Exam  Constitutional: He is oriented to person, place, and time. He appears well-developed and well-nourished. No distress.  HENT:  Head: Normocephalic and atraumatic.  Right Ear: External ear normal.  Left Ear: External ear normal.  Nose: Nose normal.  Mouth/Throat: Oropharynx is clear and moist.  Eyes: Conjunctivae and EOM are normal. Pupils are equal, round, and reactive to light.  Neck: Normal range of motion. Neck supple. Carotid bruit is not present. No thyromegaly present.  Cardiovascular: Normal rate, regular rhythm, normal heart sounds and intact distal pulses.  Exam reveals no gallop and no friction rub.   No murmur heard. Pulmonary/Chest: Effort normal and breath sounds normal. He has no wheezes. He has no rales.  Abdominal: Soft. Bowel sounds are normal. He exhibits no distension and no mass. There is no  tenderness. There is no rebound and no guarding.  Genitourinary:  Genitourinary Comments: Patient declined.  Musculoskeletal:       Right shoulder: Normal.       Left shoulder: Normal.       Cervical back: Normal.  Lymphadenopathy:    He has no cervical adenopathy.  Neurological: He is alert and oriented to person, place, and time. He has normal reflexes. No cranial nerve deficit. He exhibits normal muscle tone. Coordination normal.  Skin: Skin is warm and dry. No rash noted. He is not diaphoretic.  Psychiatric: He has a normal mood and affect. His behavior is normal. Judgment and thought content normal.    Depression screen [facility] 2/9 08/24/2016 10/12/2015  Decreased Interest 0 0  Down, Depressed, Hopeless 0 0  PHQ - 2 Score 0 0   Fall Risk  08/24/2016 10/12/2015  Falls in the past year? No No       Assessment & Plan:   1. Routine physical examination   2. Chronic seasonal allergic rhinitis due to pollen   3. Nevus, non-neoplastic   4. Family history of prostate cancer   5. Pure hypercholesterolemia   6. Screening for diabetes mellitus   7. Screening for cardiovascular condition   8. Screening for prostate cancer   9. Screening for thyroid disorder   10. Colon cancer screening   11. BMI 26.0-26.9,adult     Orders Placed This Encounter  Procedures  . CBC with Differential/Platelet  . Comprehensive metabolic panel    Order Specific Question:   Has the patient fasted?    Answer:   Yes  . Hemoglobin A1c  . Lipid panel    Order Specific Question:   Has the patient fasted?    Answer:   Yes  . PSA  . TSH  . Ambulatory referral to Gastroenterology    Referral Priority:   Routine    Referral Type:   Consultation    Referral Reason:   Specialty Services Required    Number of Visits Requested:   1  . POCT urinalysis dipstick  . EKG 12-Lead   Meds ordered this encounter  Medications  . FLUCELVAX QUADRIVALENT 0.5 ML SUSY    No Follow-up on file.   Kenly Xiao Elayne Guerin,  M.D. Primary Care at [facility] previously [facility] [address] [place], [place]  [address] [phone] phone [phone] fax

## 2016-08-25 LAB — CBC WITH DIFFERENTIAL/PLATELET
BASOS ABS: 0 10*3/uL (ref 0.0–0.2)
Basos: 1 %
EOS (ABSOLUTE): 0.1 10*3/uL (ref 0.0–0.4)
Eos: 2 %
HEMOGLOBIN: 16.4 g/dL (ref 13.0–17.7)
Hematocrit: 48 % (ref 37.5–51.0)
IMMATURE GRANS (ABS): 0 10*3/uL (ref 0.0–0.1)
Immature Granulocytes: 0 %
LYMPHS: 26 %
Lymphocytes Absolute: 1.4 10*3/uL (ref 0.7–3.1)
MCH: 31 pg (ref 26.6–33.0)
MCHC: 34.2 g/dL (ref 31.5–35.7)
MCV: 91 fL (ref 79–97)
MONOCYTES: 9 %
Monocytes Absolute: 0.5 10*3/uL (ref 0.1–0.9)
Neutrophils Absolute: 3.3 10*3/uL (ref 1.4–7.0)
Neutrophils: 62 %
Platelets: 198 10*3/uL (ref 150–379)
RBC: 5.29 x10E6/uL (ref 4.14–5.80)
RDW: 14 % (ref 12.3–15.4)
WBC: 5.3 10*3/uL (ref 3.4–10.8)

## 2016-08-25 LAB — LIPID PANEL
CHOL/HDL RATIO: 3.5 ratio (ref 0.0–5.0)
Cholesterol, Total: 280 mg/dL — ABNORMAL HIGH (ref 100–199)
HDL: 79 mg/dL (ref >39)
LDL CALC: 182 mg/dL — AB (ref 0–99)
TRIGLYCERIDES: 94 mg/dL (ref 0–149)
VLDL Cholesterol Cal: 19 mg/dL (ref 5–40)

## 2016-08-25 LAB — TSH: TSH: 2.23 u[IU]/mL (ref 0.450–4.500)

## 2016-08-25 LAB — COMPREHENSIVE METABOLIC PANEL
ALBUMIN: 5 g/dL — AB (ref 3.6–4.8)
ALT: 22 IU/L (ref 0–44)
AST: 17 IU/L (ref 0–40)
Albumin/Globulin Ratio: 2.4 — ABNORMAL HIGH (ref 1.2–2.2)
Alkaline Phosphatase: 48 IU/L (ref 39–117)
BUN / CREAT RATIO: 14 (ref 10–24)
BUN: 14 mg/dL (ref 8–27)
Bilirubin Total: 1.4 mg/dL — ABNORMAL HIGH (ref 0.0–1.2)
CALCIUM: 9.4 mg/dL (ref 8.6–10.2)
CO2: 25 mmol/L (ref 18–29)
CREATININE: 0.97 mg/dL (ref 0.76–1.27)
Chloride: 99 mmol/L (ref 96–106)
GFR calc non Af Amer: 83 mL/min/{1.73_m2} (ref >59)
GFR, EST AFRICAN AMERICAN: 96 mL/min/{1.73_m2} (ref >59)
GLUCOSE: 101 mg/dL — AB (ref 65–99)
Globulin, Total: 2.1 g/dL (ref 1.5–4.5)
Potassium: 4.4 mmol/L (ref 3.5–5.2)
Sodium: 142 mmol/L (ref 134–144)
TOTAL PROTEIN: 7.1 g/dL (ref 6.0–8.5)

## 2016-08-25 LAB — PSA: Prostate Specific Ag, Serum: 4.8 ng/mL — ABNORMAL HIGH (ref 0.0–4.0)

## 2016-08-25 LAB — HEMOGLOBIN A1C
Est. average glucose Bld gHb Est-mCnc: 91 mg/dL
HEMOGLOBIN A1C: 4.8 % (ref 4.8–5.6)

## 2016-09-13 ENCOUNTER — Encounter: Payer: Self-pay | Admitting: Family Medicine

## 2016-12-20 ENCOUNTER — Telehealth: Payer: Self-pay | Admitting: Gastroenterology

## 2016-12-20 NOTE — Telephone Encounter (Signed)
Patient is returning a call for a colonoscopy. Please call

## 2016-12-22 NOTE — Telephone Encounter (Signed)
LVM for patient callback in response to message to scheduled colonoscopy with Dr. Allen Norris.

## 2016-12-26 ENCOUNTER — Other Ambulatory Visit: Payer: Self-pay

## 2016-12-26 ENCOUNTER — Telehealth: Payer: Self-pay

## 2016-12-26 ENCOUNTER — Encounter: Payer: Self-pay | Admitting: *Deleted

## 2016-12-26 DIAGNOSIS — Z1212 Encounter for screening for malignant neoplasm of rectum: Principal | ICD-10-CM

## 2016-12-26 DIAGNOSIS — Z1211 Encounter for screening for malignant neoplasm of colon: Secondary | ICD-10-CM

## 2016-12-26 NOTE — Telephone Encounter (Signed)
Gastroenterology Pre-Procedure Review  Request Date: 01/27/17 Requesting Physician: Dr. Allen Norris  PATIENT REVIEW QUESTIONS: The patient responded to the following health history questions as indicated:    1. Are you having any GI issues? no 2. Do you have a personal history of Polyps? no 3. Do you have a family history of Colon Cancer or Polyps? no 4. Diabetes Mellitus? no 5. Joint replacements in the past 12 months?no 6. Major health problems in the past 3 months?no 7. Any artificial heart valves, MVP, or defibrillator?no    MEDICATIONS & ALLERGIES:    Patient reports the following regarding taking any anticoagulation/antiplatelet therapy:   Plavix, Coumadin, Eliquis, Xarelto, Lovenox, Pradaxa, Brilinta, or Effient? no Aspirin? yes (81mg )  Patient confirms/reports the following medications:  Current Outpatient Prescriptions  Medication Sig Dispense Refill  . fexofenadine (ALLEGRA) 180 MG tablet Take 180 mg by mouth daily.    . fish oil-omega-3 fatty acids 1000 MG capsule Take 2 g by mouth daily.    Marland Kitchen FLUCELVAX QUADRIVALENT 0.5 ML SUSY     . meloxicam (MOBIC) 15 MG tablet Take 1 tablet (15 mg total) by mouth daily. (Patient not taking: Reported on 08/24/2016) 30 tablet 0  . methocarbamol (ROBAXIN) 500 MG tablet Take 1-2 tablets (500-1,000 mg total) by mouth at bedtime. (Patient not taking: Reported on 08/24/2016) 60 tablet 0  . mometasone (NASONEX) 50 MCG/ACT nasal spray Place 2 sprays into the nose daily. (Patient not taking: Reported on 10/12/2015) 17 g 11  . Multiple Vitamin (MULTIVITAMIN) tablet Take 1 tablet by mouth daily.    Marland Kitchen zoster vaccine live, PF, (ZOSTAVAX) 16579 UNT/0.65ML injection Inject 19,400 Units into the skin once. 1 each 0   No current facility-administered medications for this visit.     Patient confirms/reports the following allergies:  Allergies  Allergen Reactions  . Penicillins     No orders of the defined types were placed in this  encounter.   AUTHORIZATION INFORMATION Primary Insurance: 1D#: Group #:  Secondary Insurance: 1D#: Group #:  SCHEDULE INFORMATION: Date: 01/27/17 Time: Location: [place]

## 2016-12-30 ENCOUNTER — Telehealth: Payer: Self-pay | Admitting: Gastroenterology

## 2016-12-30 NOTE — Telephone Encounter (Signed)
Patient LVM and received prepping packet and has some questions, Please call. Also, please remind him that we haven't received his updated insurance information.

## 2016-12-30 NOTE — Telephone Encounter (Signed)
Returned patients call. LVM for call back

## 2017-01-02 ENCOUNTER — Telehealth: Payer: Self-pay | Admitting: Gastroenterology

## 2017-01-02 NOTE — Telephone Encounter (Signed)
Needs to reschedule colonoscopy. Going out of town

## 2017-01-03 NOTE — Telephone Encounter (Signed)
Patient has requested date change for his colonoscopy his new date is 02/24/17 Friday with Dr. Allen Norris at [facility].  Maudie Mercury has been informed of date change.

## 2017-02-16 ENCOUNTER — Encounter: Payer: Self-pay | Admitting: *Deleted

## 2017-02-17 NOTE — Discharge Instructions (Signed)
General Anesthesia, Adult, Care After °These instructions provide you with information about caring for yourself after your procedure. Your health care provider may also give you more specific instructions. Your treatment has been planned according to current medical practices, but problems sometimes occur. Call your health care provider if you have any problems or questions after your procedure. °What can I expect after the procedure? °After the procedure, it is common to have: °· Vomiting. °· A sore throat. °· Mental slowness. ° °It is common to feel: °· Nauseous. °· Cold or shivery. °· Sleepy. °· Tired. °· Sore or achy, even in parts of your body where you did not have surgery. ° °Follow these instructions at home: °For at least 24 hours after the procedure: °· Do not: °? Participate in activities where you could fall or become injured. °? Drive. °? Use heavy machinery. °? Drink alcohol. °? Take sleeping pills or medicines that cause drowsiness. °? Make important decisions or sign legal documents. °? Take care of children on your own. °· Rest. °Eating and drinking °· If you vomit, drink water, juice, or soup when you can drink without vomiting. °· Drink enough fluid to keep your urine clear or pale yellow. °· Make sure you have little or no nausea before eating solid foods. °· Follow the diet recommended by your health care provider. °General instructions °· Have a responsible adult stay with you until you are awake and alert. °· Return to your normal activities as told by your health care provider. Ask your health care provider what activities are safe for you. °· Take over-the-counter and prescription medicines only as told by your health care provider. °· If you smoke, do not smoke without supervision. °· Keep all follow-up visits as told by your health care provider. This is important. °Contact a health care provider if: °· You continue to have nausea or vomiting at home, and medicines are not helpful. °· You  cannot drink fluids or start eating again. °· You cannot urinate after 8-12 hours. °· You develop a skin rash. °· You have fever. °· You have increasing redness at the site of your procedure. °Get help right away if: °· You have difficulty breathing. °· You have chest pain. °· You have unexpected bleeding. °· You feel that you are having a life-threatening or urgent problem. °This information is not intended to replace advice given to you by your health care provider. Make sure you discuss any questions you have with your health care provider. °Document Released: 09/12/2000 Document Revised: 11/09/2015 Document Reviewed: 05/21/2015 °Elsevier Interactive Patient Education © 2018 [employer] Inc[employer] ° °

## 2017-02-24 ENCOUNTER — Ambulatory Visit: Payer: BLUE CROSS/BLUE SHIELD | Admitting: Anesthesiology

## 2017-02-24 ENCOUNTER — Encounter
Admission: RE | Disposition: A | Discharge status: Home / Self Care | Payer: Self-pay | Source: Ambulatory Visit | Attending: Gastroenterology

## 2017-02-24 ENCOUNTER — Ambulatory Visit
Admission: RE | Admit: 2017-02-24 | Discharge: 2017-02-24 | Disposition: A | Discharge status: Home / Self Care | Payer: BLUE CROSS/BLUE SHIELD | Source: Ambulatory Visit | Attending: Gastroenterology | Admitting: Gastroenterology

## 2017-02-24 DIAGNOSIS — K635 Polyp of colon: Secondary | ICD-10-CM | POA: Insufficient documentation

## 2017-02-24 DIAGNOSIS — K5730 Diverticulosis of large intestine without perforation or abscess without bleeding: Secondary | ICD-10-CM | POA: Diagnosis not present

## 2017-02-24 DIAGNOSIS — Z79899 Other long term (current) drug therapy: Secondary | ICD-10-CM | POA: Diagnosis not present

## 2017-02-24 DIAGNOSIS — Z87891 Personal history of nicotine dependence: Secondary | ICD-10-CM | POA: Insufficient documentation

## 2017-02-24 DIAGNOSIS — Z1211 Encounter for screening for malignant neoplasm of colon: Secondary | ICD-10-CM

## 2017-02-24 DIAGNOSIS — D123 Benign neoplasm of transverse colon: Secondary | ICD-10-CM | POA: Diagnosis not present

## 2017-02-24 DIAGNOSIS — D124 Benign neoplasm of descending colon: Secondary | ICD-10-CM | POA: Diagnosis not present

## 2017-02-24 DIAGNOSIS — Z7982 Long term (current) use of aspirin: Secondary | ICD-10-CM | POA: Insufficient documentation

## 2017-02-24 DIAGNOSIS — Z1212 Encounter for screening for malignant neoplasm of rectum: Secondary | ICD-10-CM

## 2017-02-24 HISTORY — DX: Family history of other specified conditions: Z84.89

## 2017-02-24 HISTORY — PX: COLONOSCOPY WITH PROPOFOL: SHX5780

## 2017-02-24 HISTORY — PX: POLYPECTOMY: SHX5525

## 2017-02-24 SURGERY — COLONOSCOPY WITH PROPOFOL
Anesthesia: General | Wound class: Contaminated

## 2017-02-24 MED ORDER — PROPOFOL 10 MG/ML IV BOLUS
INTRAVENOUS | Status: DC | PRN
  Administered 2017-02-24 (×2): 20 mg via INTRAVENOUS
  Administered 2017-02-24: 80 mg via INTRAVENOUS
  Administered 2017-02-24 (×7): 20 mg via INTRAVENOUS

## 2017-02-24 MED ORDER — OXYCODONE HCL 5 MG PO TABS: 5.0000 mg | ORAL_TABLET | Freq: Once | ORAL | Status: DC | PRN

## 2017-02-24 MED ORDER — ACETAMINOPHEN 160 MG/5ML PO SOLN: 325.0000 mg | ORAL | Status: DC | PRN

## 2017-02-24 MED ORDER — MEPERIDINE HCL 25 MG/ML IJ SOLN: 6.2500 mg | INTRAMUSCULAR | Status: DC | PRN

## 2017-02-24 MED ORDER — PROMETHAZINE HCL 25 MG/ML IJ SOLN: 6.2500 mg | INTRAMUSCULAR | Status: DC | PRN

## 2017-02-24 MED ORDER — ACETAMINOPHEN 325 MG PO TABS: 325.0000 mg | ORAL_TABLET | ORAL | Status: DC | PRN

## 2017-02-24 MED ORDER — LIDOCAINE HCL (CARDIAC) 20 MG/ML IV SOLN
INTRAVENOUS | Status: DC | PRN
  Administered 2017-02-24: 50 mg via INTRAVENOUS

## 2017-02-24 MED ORDER — LACTATED RINGERS IV SOLN
10.0000 mL/h | INTRAVENOUS | Status: DC
  Administered 2017-02-24: 10:00:00 via INTRAVENOUS
  Administered 2017-02-24: 10 mL/h via INTRAVENOUS

## 2017-02-24 MED ORDER — OXYCODONE HCL 5 MG/5ML PO SOLN: 5.0000 mg | Freq: Once | ORAL | Status: DC | PRN

## 2017-02-24 MED ORDER — STERILE WATER FOR IRRIGATION IR SOLN
Status: DC | PRN
  Administered 2017-02-24: 10:00:00

## 2017-02-24 MED ORDER — SODIUM CHLORIDE 0.9 % IV SOLN: INTRAVENOUS | Status: DC

## 2017-02-24 MED ORDER — FENTANYL CITRATE (PF) 100 MCG/2ML IJ SOLN: 25.0000 ug | INTRAMUSCULAR | Status: DC | PRN

## 2017-02-24 SURGICAL SUPPLY — 23 items
CANISTER SUCT 1200ML W/VALVE (MISCELLANEOUS) ×4 IMPLANT
CLIP HMST 235XBRD CATH ROT (MISCELLANEOUS) IMPLANT
CLIP RESOLUTION 360 11X235 (MISCELLANEOUS)
FCP ESCP3.2XJMB 240X2.8X (MISCELLANEOUS)
FORCEPS BIOP RAD 4 LRG CAP 4 (CUTTING FORCEPS) ×4 IMPLANT
FORCEPS BIOP RJ4 240 W/NDL (MISCELLANEOUS)
FORCEPS ESCP3.2XJMB 240X2.8X (MISCELLANEOUS) IMPLANT
GOWN CVR UNV OPN BCK APRN NK (MISCELLANEOUS) ×4 IMPLANT
GOWN ISOL THUMB LOOP REG UNIV (MISCELLANEOUS) ×4
INJECTOR VARIJECT VIN23 (MISCELLANEOUS) IMPLANT
KIT DEFENDO VALVE AND CONN (KITS) IMPLANT
KIT ENDO PROCEDURE OLY (KITS) ×4 IMPLANT
MARKER SPOT ENDO TATTOO 5ML (MISCELLANEOUS) IMPLANT
PAD GROUND ADULT SPLIT (MISCELLANEOUS) IMPLANT
PROBE APC STR FIRE (PROBE) IMPLANT
RETRIEVER NET ROTH 2.5X230 LF (MISCELLANEOUS) IMPLANT
SNARE SHORT THROW 13M SML OVAL (MISCELLANEOUS) ×4 IMPLANT
SNARE SHORT THROW 30M LRG OVAL (MISCELLANEOUS) IMPLANT
SNARE SNG USE RND 15MM (INSTRUMENTS) IMPLANT
SPOT EX ENDOSCOPIC TATTOO (MISCELLANEOUS)
TRAP ETRAP POLY (MISCELLANEOUS) ×4 IMPLANT
VARIJECT INJECTOR VIN23 (MISCELLANEOUS)
WATER STERILE IRR 250ML POUR (IV SOLUTION) ×4 IMPLANT

## 2017-02-24 NOTE — Anesthesia Postprocedure Evaluation (Signed)
Anesthesia Post Note  Patient: Ian Campos  Procedure(s) Performed: Procedure(s) (LRB): COLONOSCOPY WITH PROPOFOL (N/A) POLYPECTOMY  Patient location during evaluation: PACU Anesthesia Type: General Level of consciousness: awake and alert Pain management: pain level controlled Vital Signs Assessment: post-procedure vital signs reviewed and stable Respiratory status: spontaneous breathing, nonlabored ventilation, respiratory function stable and patient connected to nasal cannula oxygen Cardiovascular status: blood pressure returned to baseline and stable Postop Assessment: no signs of nausea or vomiting Anesthetic complications: no    Trecia Rogers

## 2017-02-24 NOTE — Transfer of Care (Signed)
Immediate Anesthesia Transfer of Care Note  Patient: Ian Campos  Procedure(s) Performed: Procedure(s): COLONOSCOPY WITH PROPOFOL (N/A) POLYPECTOMY  Patient Location: PACU  Anesthesia Type: General  Level of Consciousness: awake, alert  and patient cooperative  Airway and Oxygen Therapy: Patient Spontanous Breathing and Patient connected to supplemental oxygen  Post-op Assessment: Post-op Vital signs reviewed, Patient's Cardiovascular Status Stable, Respiratory Function Stable, Patent Airway and No signs of Nausea or vomiting  Post-op Vital Signs: Reviewed and stable  Complications: No apparent anesthesia complications

## 2017-02-24 NOTE — Anesthesia Preprocedure Evaluation (Signed)
Anesthesia Evaluation  Patient identified by MRN, date of birth, ID band Patient awake    Reviewed: Allergy & Precautions, H&P , NPO status , Patient's Chart, lab work & pertinent test results, reviewed documented beta blocker date and time   Airway Mallampati: II  TM Distance: >3 FB Neck ROM: full    Dental no notable dental hx.    Pulmonary neg pulmonary ROS, former smoker,    Pulmonary exam normal breath sounds clear to auscultation       Cardiovascular Exercise Tolerance: Good negative cardio ROS Normal cardiovascular exam Rhythm:regular Rate:Normal     Neuro/Psych negative neurological ROS  negative psych ROS   GI/Hepatic negative GI ROS, Neg liver ROS,   Endo/Other  negative endocrine ROS  Renal/GU negative Renal ROS  negative genitourinary   Musculoskeletal   Abdominal   Peds  Hematology negative hematology ROS (+)   Anesthesia Other Findings   Reproductive/Obstetrics negative OB ROS                            Anesthesia Physical Anesthesia Plan  ASA: II  Anesthesia Plan: General   Post-op Pain Management:    Induction:   PONV Risk Score and Plan:   Airway Management Planned:   Additional Equipment:   Intra-op Plan:   Post-operative Plan:   Informed Consent: I have reviewed the patients History and Physical, chart, labs and discussed the procedure including the risks, benefits and alternatives for the proposed anesthesia with the patient or authorized representative who has indicated his/her understanding and acceptance.   Dental Advisory Given  Plan Discussed with: CRNA and Anesthesiologist  Anesthesia Plan Comments:        Anesthesia Quick Evaluation

## 2017-02-24 NOTE — Anesthesia Procedure Notes (Signed)
Performed by: Yoshiko Keleher Pre-anesthesia Checklist: Patient identified, Emergency Drugs available, Suction available, Timeout performed and Patient being monitored Patient Re-evaluated:Patient Re-evaluated prior to induction Oxygen Delivery Method: Nasal cannula Placement Confirmation: positive ETCO2       

## 2017-02-24 NOTE — Op Note (Signed)
[facility] Patient Name: Ian Campos Procedure Date: 02/24/2017 9:38 AM MRN: [id number] Account #: [id number] Date of Birth: April 05, 1956 Admit Type: Outpatient Age: 61 Room: [facility] OR ROOM 01 Gender: Male Note Status: Finalized Procedure:            Colonoscopy Indications:          Screening for colorectal malignant neoplasm Providers:            Lucilla Lame MD, MD Referring MD:         Renette Butters. Tamala Julian, MD (Referring MD) Medicines:            Propofol per Anesthesia Complications:        No immediate complications. Procedure:            Pre-Anesthesia Assessment:                       - Prior to the procedure, a History and Physical was                        performed, and patient medications and allergies were                        reviewed. The patient's tolerance of previous                        anesthesia was also reviewed. The risks and benefits of                        the procedure and the sedation options and risks were                        discussed with the patient. All questions were                        answered, and informed consent was obtained. Prior                        Anticoagulants: The patient has taken no previous                        anticoagulant or antiplatelet agents. ASA Grade                        Assessment: II - A patient with mild systemic disease.                        After reviewing the risks and benefits, the patient was                        deemed in satisfactory condition to undergo the                        procedure.                       After obtaining informed consent, the colonoscope was                        passed under direct vision. Throughout the procedure,  the patient's blood pressure, pulse, and oxygen                        saturations were monitored continuously. The [place] (S#: [id number]) was introduced through                         the anus and advanced to the the cecum, identified by                        appendiceal orifice and ileocecal valve. The                        colonoscopy was performed without difficulty. The                        patient tolerated the procedure well. The quality of                        the bowel preparation was excellent. Findings:      The perianal and digital rectal examinations were normal.      A 8 mm polyp was found in the transverse colon. The polyp was sessile.       The polyp was removed with a cold snare. Resection and retrieval were       complete.      Two sessile polyps were found in the descending colon. The polyps were 2       to 3 mm in size. These polyps were removed with a cold biopsy forceps.       Resection and retrieval were complete.      Multiple small-mouthed diverticula were found in the sigmoid colon. Impression:           - One 8 mm polyp in the transverse colon, removed with                        a cold snare. Resected and retrieved.                       - Two 2 to 3 mm polyps in the descending colon, removed                        with a cold biopsy forceps. Resected and retrieved.                       - Diverticulosis in the sigmoid colon. Recommendation:       - Discharge patient to home.                       - Resume previous diet.                       - Continue present medications.                       - Await pathology results.                       - Repeat colonoscopy in 5  years if polyp adenoma and 10                        years if hyperplastic Procedure Code(s):    --- Professional ---                       [id number], Colonoscopy, flexible; with removal of tumor(s),                        polyp(s), or other lesion(s) by snare technique                       45380, 76, Colonoscopy, flexible; with biopsy, single                        or multiple Diagnosis Code(s):    --- Professional ---                        Z12.11, Encounter for screening for malignant neoplasm                        of colon                       D12.3, Benign neoplasm of transverse colon (hepatic                        flexure or splenic flexure)                       D12.4, Benign neoplasm of descending colon CPT copyright 2016 American Medical Association. All rights reserved. The codes documented in this report are preliminary and upon coder review may  be revised to meet current compliance requirements. Lucilla Lame MD, MD 02/24/2017 10:08:41 AM This report has been signed electronically. Number of Addenda: 0 Note Initiated On: 02/24/2017 9:38 AM Scope Withdrawal Time: 0 hours 9 minutes 55 seconds  Total Procedure Duration: 0 hours 12 minutes 51 seconds       [facility]

## 2017-02-24 NOTE — H&P (Signed)
Ian Lame, MD [facility] [address], [place], [place] [address] Phone: [phone] Fax : [phone]  Primary Care Physician:  Wardell Honour, MD Primary Gastroenterologist:  Dr. Allen Norris  Pre-Procedure History & Physical: HPI:  Ian Campos is a 61 y.o. male is here for a screening colonoscopy.   Past Medical History:  Diagnosis Date  . Allergy    Nasonex  . Benign paroxysmal positional vertigo    x1. 5-6 yrs ago  . Elevated blood pressure reading without diagnosis of hypertension   . Erectile dysfunction   . Family history of adverse reaction to anesthesia    Mother - slow to wake  . Gilbert's disease   . Nonspecific abnormal finding in stool contents     Past Surgical History:  Procedure Laterality Date  . COLONOSCOPY  04/24/2007   normal.  Ian Campos.  . EYE SURGERY     Lasik  . TREATMENT FISTULA ANAL  06/20/1978  . VASECTOMY      Prior to Admission medications   Medication Sig Start Date End Date Taking? Authorizing Provider  Ascorbic Acid (VITAMIN C PO) Take by mouth.   Yes [provider]  ASPIRIN 81 PO Take by mouth daily.   Yes [provider]  cetirizine (ZYRTEC) 10 MG tablet Take 10 mg by mouth daily.   Yes [provider]  fish oil-omega-3 fatty acids 1000 MG capsule Take 2 g by mouth daily.   Yes [provider]  Multiple Vitamin (MULTIVITAMIN) tablet Take 1 tablet by mouth daily.   Yes [provider]  Triamcinolone Acetonide (NASACORT AQ NA) Place into the nose daily.   Yes [provider]  FLUCELVAX QUADRIVALENT 0.5 ML SUSY  07/16/16   [provider]  meloxicam (MOBIC) 15 MG tablet Take 1 tablet (15 mg total) by mouth daily. Patient not taking: Reported on 08/24/2016 10/12/15   Wardell Honour, MD  methocarbamol (ROBAXIN) 500 MG tablet Take 1-2 tablets (500-1,000 mg total) by mouth at bedtime. Patient not taking: Reported on 08/24/2016 10/12/15   Wardell Honour, MD  mometasone (NASONEX) 50  MCG/ACT nasal spray Place 2 sprays into the nose daily. Patient not taking: Reported on 10/12/2015 09/10/12   Wardell Honour, MD    Allergies as of 12/26/2016 - Review Complete 09/13/2016  Allergen Reaction Noted  . Penicillins  09/10/2012    Family History  Problem Relation Age of Onset  . Stroke Mother 63  . Heart disease Mother 16       CABG age 20  . Hypertension Mother   . Hyperlipidemia Brother   . Hypertension Brother   . Heart disease Brother 39       AMI  . Heart disease Brother   . Hyperlipidemia Brother   . Hypertension Brother   . COPD Brother   . Mental illness Father   . Cancer Brother        prostate cancer  . Heart disease Maternal Grandmother   . Heart disease Maternal Grandfather   . Hyperlipidemia Maternal Grandfather     Social History   Social History  . Marital status: Married    Spouse name: N/A  . Number of children: 1  . Years of education: 49   Occupational History  . [employer].     fulltime x 18 years   Social History Main Topics  . Smoking status: Former Smoker    Packs/day: 0.50    Types: Cigarettes  Quit date: 70  . Smokeless tobacco: Never Used  . Alcohol use 14.4 oz/week    24 Cans of beer per week     Comment:    . Drug use: No  . Sexual activity: Yes   Other Topics Concern  . Not on file   Social History Narrative   Marital status:  Married x 42 years.      Children:  1 child Ian Campos), 2 grandchildren.      Lives: with wife.        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  Travels a lot.      Tobacco:  Stopped age 40.      Alcohol: 3-8 beers per night; more on weekends.  No DWIs.      Drugs:  None      Exercise:  none      Seatbelt: 100%.      Sunscreen:  SPF 15.        Guns:  Several; locked/secured.        Caffeine use: Consumes moderate amount daily.      Smoke alarm in the home.          Review of Systems: See HPI, otherwise negative  ROS  Physical Exam: Ht 6\' 5"  (1.956 m)   Wt 227 lb (103 kg)   BMI 26.92 kg/m  General:   Alert,  pleasant and cooperative in NAD Head:  Normocephalic and atraumatic. Neck:  Supple; no masses or thyromegaly. Lungs:  Clear throughout to auscultation.    Heart:  Regular rate and rhythm. Abdomen:  Soft, nontender and nondistended. Normal bowel sounds, without guarding, and without rebound.   Neurologic:  Alert and  oriented x4;  grossly normal neurologically.  Impression/Plan: Ian Campos is now here to undergo a screening colonoscopy.  Risks, benefits, and alternatives regarding colonoscopy have been reviewed with the patient.  Questions have been answered.  All parties agreeable.

## 2017-03-01 ENCOUNTER — Encounter: Payer: Self-pay | Admitting: Gastroenterology

## 2017-03-03 ENCOUNTER — Encounter: Payer: Self-pay | Admitting: Gastroenterology

## 2017-07-24 ENCOUNTER — Ambulatory Visit (INDEPENDENT_AMBULATORY_CARE_PROVIDER_SITE_OTHER): Payer: BLUE CROSS/BLUE SHIELD | Admitting: Family Medicine

## 2017-07-24 ENCOUNTER — Encounter: Payer: Self-pay | Admitting: Family Medicine

## 2017-07-24 ENCOUNTER — Other Ambulatory Visit: Payer: Self-pay

## 2017-07-24 VITALS — BP 140/78 | HR 80 | Temp 98.0°F | Resp 16 | Ht 77.95 in | Wt 229.0 lb

## 2017-07-24 DIAGNOSIS — Z1329 Encounter for screening for other suspected endocrine disorder: Secondary | ICD-10-CM | POA: Diagnosis not present

## 2017-07-24 DIAGNOSIS — Z131 Encounter for screening for diabetes mellitus: Secondary | ICD-10-CM | POA: Diagnosis not present

## 2017-07-24 DIAGNOSIS — Z0000 Encounter for general adult medical examination without abnormal findings: Secondary | ICD-10-CM | POA: Diagnosis not present

## 2017-07-24 DIAGNOSIS — J301 Allergic rhinitis due to pollen: Secondary | ICD-10-CM | POA: Diagnosis not present

## 2017-07-24 DIAGNOSIS — Z125 Encounter for screening for malignant neoplasm of prostate: Secondary | ICD-10-CM

## 2017-07-24 DIAGNOSIS — Z8042 Family history of malignant neoplasm of prostate: Secondary | ICD-10-CM | POA: Diagnosis not present

## 2017-07-24 DIAGNOSIS — Z1159 Encounter for screening for other viral diseases: Secondary | ICD-10-CM

## 2017-07-24 DIAGNOSIS — Z13220 Encounter for screening for lipoid disorders: Secondary | ICD-10-CM | POA: Diagnosis not present

## 2017-07-24 DIAGNOSIS — Z23 Encounter for immunization: Secondary | ICD-10-CM | POA: Diagnosis not present

## 2017-07-24 DIAGNOSIS — Z114 Encounter for screening for human immunodeficiency virus [HIV]: Secondary | ICD-10-CM

## 2017-07-24 LAB — POCT URINALYSIS DIP (MANUAL ENTRY)
BILIRUBIN UA: NEGATIVE
BILIRUBIN UA: NEGATIVE mg/dL
Blood, UA: NEGATIVE
Glucose, UA: NEGATIVE mg/dL
LEUKOCYTES UA: NEGATIVE
Nitrite, UA: NEGATIVE
PH UA: 6 (ref 5.0–8.0)
PROTEIN UA: NEGATIVE mg/dL
SPEC GRAV UA: 1.01 (ref 1.010–1.025)
Urobilinogen, UA: 0.2 E.U./dL

## 2017-07-24 MED ORDER — ZOSTER VAC RECOMB ADJUVANTED 50 MCG/0.5ML IM SUSR: 0.5000 mL | Freq: Once | INTRAMUSCULAR | 1 refills | Status: AC

## 2017-07-24 NOTE — Patient Instructions (Addendum)
   IF you received an x-ray today, you will receive an invoice from [facility]. Please contact [facility] at [phone] with questions or concerns regarding your invoice.   IF you received labwork today, you will receive an invoice from [facility]. Please contact [facility] at [phone] with questions or concerns regarding your invoice.   Our billing staff will not be able to assist you with questions regarding bills from these companies.  You will be contacted with the lab results as soon as they are available. The fastest way to get your results is to activate your My Chart account. Instructions are located on the last page of this paperwork. If you have not heard from us regarding the results in 2 weeks, please contact this office.      Preventive Care 40-64 Years, Male Preventive care refers to lifestyle choices and visits with your health care provider that can promote health and wellness. What does preventive care include?  A yearly physical exam. This is also called an annual well check.  Dental exams once or twice a year.  Routine eye exams. Ask your health care provider how often you should have your eyes checked.  Personal lifestyle choices, including: ? Daily care of your teeth and gums. ? Regular physical activity. ? Eating a healthy diet. ? Avoiding tobacco and drug use. ? Limiting alcohol use. ? Practicing safe sex. ? Taking low-dose aspirin every day starting at age 50. What happens during an annual well check? The services and screenings done by your health care provider during your annual well check will depend on your age, overall health, lifestyle risk factors, and family history of disease. Counseling Your health care provider may ask you questions about your:  Alcohol use.  Tobacco use.  Drug use.  Emotional well-being.  Home and relationship well-being.  Sexual activity.  Eating habits.  Work and work  environment.  Screening You may have the following tests or measurements:  Height, weight, and BMI.  Blood pressure.  Lipid and cholesterol levels. These may be checked every 5 years, or more frequently if you are over 50 years old.  Skin check.  Lung cancer screening. You may have this screening every year starting at age 55 if you have a 30-pack-year history of smoking and currently smoke or have quit within the past 15 years.  Fecal occult blood test (FOBT) of the stool. You may have this test every year starting at age 50.  Flexible sigmoidoscopy or colonoscopy. You may have a sigmoidoscopy every 5 years or a colonoscopy every 10 years starting at age 50.  Prostate cancer screening. Recommendations will vary depending on your family history and other risks.  Hepatitis C blood test.  Hepatitis B blood test.  Sexually transmitted disease (STD) testing.  Diabetes screening. This is done by checking your blood sugar (glucose) after you have not eaten for a while (fasting). You may have this done every 1-3 years.  Discuss your test results, treatment options, and if necessary, the need for more tests with your health care provider. Vaccines Your health care provider may recommend certain vaccines, such as:  Influenza vaccine. This is recommended every year.  Tetanus, diphtheria, and acellular pertussis (Tdap, Td) vaccine. You may need a Td booster every 10 years.  Varicella vaccine. You may need this if you have not been vaccinated.  Zoster vaccine. You may need this after age 60.  Measles, mumps, and rubella (MMR) vaccine. You may need at least one dose   of MMR if you were born in 1957 or later. You may also need a second dose.  Pneumococcal 13-valent conjugate (PCV13) vaccine. You may need this if you have certain conditions and have not been vaccinated.  Pneumococcal polysaccharide (PPSV23) vaccine. You may need one or two doses if you smoke cigarettes or if you have  certain conditions.  Meningococcal vaccine. You may need this if you have certain conditions.  Hepatitis A vaccine. You may need this if you have certain conditions or if you travel or work in places where you may be exposed to hepatitis A.  Hepatitis B vaccine. You may need this if you have certain conditions or if you travel or work in places where you may be exposed to hepatitis B.  Haemophilus influenzae type b (Hib) vaccine. You may need this if you have certain risk factors.  Talk to your health care provider about which screenings and vaccines you need and how often you need them. This information is not intended to replace advice given to you by your health care provider. Make sure you discuss any questions you have with your health care provider. Document Released: 07/03/2015 Document Revised: 02/24/2016 Document Reviewed: 04/07/2015 Elsevier Interactive Patient Education  [employer].

## 2017-07-24 NOTE — Progress Notes (Signed)
Subjective:    Patient ID: Ian Campos, male    DOB: 1955-10-01, 62 y.o.   MRN: [id number]  07/24/2017  Annual Exam    HPI This 62 y.o. male presents for Routine Physical Examination.  Last physical:  08-2016 Colonoscopy:  2018 PSA:  08-2016    Visual Acuity Screening   Right eye Left eye Both eyes  Without correction: 20/15 20/15 20/15   With correction:       BP Readings from Last 3 Encounters:  07/24/17 140/78  02/24/17 118/67  08/24/16 (!) 141/78   Wt Readings from Last 3 Encounters:  07/24/17 229 lb (103.9 kg)  02/16/17 227 lb (103 kg)  08/24/16 227 lb (103 kg)   Immunization History  Administered Date(s) Administered  . Influenza-Unspecified 07/16/2016, 03/20/2017    Review of Systems  Constitutional: Negative for activity change, appetite change, chills, diaphoresis, fatigue, fever and unexpected weight change.  HENT: Negative for congestion, dental problem, drooling, ear discharge, ear pain, facial swelling, hearing loss, mouth sores, nosebleeds, postnasal drip, rhinorrhea, sinus pressure, sneezing, sore throat, tinnitus, trouble swallowing and voice change.   Eyes: Negative for photophobia, pain, discharge, redness, itching and visual disturbance.  Respiratory: Negative for apnea, cough, choking, chest tightness, shortness of breath, wheezing and stridor.   Cardiovascular: Negative for chest pain, palpitations and leg swelling.  Gastrointestinal: Negative for abdominal pain, blood in stool, constipation, diarrhea, nausea and vomiting.  Endocrine: Negative for cold intolerance, heat intolerance, polydipsia, polyphagia and polyuria.  Genitourinary: Negative for decreased urine volume, difficulty urinating, discharge, dysuria, enuresis, flank pain, frequency, genital sores, hematuria, penile pain, penile swelling, scrotal swelling, testicular pain and urgency.       Nocturia 0-1.  Urinary stream is strong.    Musculoskeletal: Negative for arthralgias, back  pain, gait problem, joint swelling, myalgias, neck pain and neck stiffness.  Skin: Negative for color change, pallor, rash and wound.  Allergic/Immunologic: Negative for environmental allergies, food allergies and immunocompromised state.  Neurological: Negative for dizziness, tremors, seizures, syncope, facial asymmetry, speech difficulty, weakness, light-headedness, numbness and headaches.  Hematological: Negative for adenopathy. Does not bruise/bleed easily.  Psychiatric/Behavioral: Negative for agitation, behavioral problems, confusion, decreased concentration, dysphoric mood, hallucinations, self-injury, sleep disturbance and suicidal ideas. The patient is not nervous/anxious and is not hyperactive.        Bedtime 1130-930; wakes up 700.    Past Medical History:  Diagnosis Date  . Allergy    Nasonex  . Benign paroxysmal positional vertigo    x1. 5-6 yrs ago  . Elevated blood pressure reading without diagnosis of hypertension   . Erectile dysfunction   . Family history of adverse reaction to anesthesia    Mother - slow to wake  . Gilbert's disease   . Nonspecific abnormal finding in stool contents    Past Surgical History:  Procedure Laterality Date  . COLONOSCOPY  04/24/2007   normal.  [place].  . COLONOSCOPY WITH PROPOFOL N/A 02/24/2017   Procedure: COLONOSCOPY WITH PROPOFOL;  Surgeon: Lucilla Lame, MD;  Location: [facility];  Service: Endoscopy;  Laterality: N/A;  . EYE SURGERY     Lasik  . POLYPECTOMY  02/24/2017   Procedure: POLYPECTOMY;  Surgeon: Lucilla Lame, MD;  Location: [facility];  Service: Endoscopy;;  . TREATMENT FISTULA ANAL  06/20/1978  . VASECTOMY     Allergies  Allergen Reactions  . Other     Had reaction as a child to one of the "mycins". Does not know which one, or reaction  .  Penicillins     Childhood reaction   Current Outpatient Medications on File Prior to Visit  Medication Sig Dispense Refill  . Ascorbic Acid (VITAMIN C PO) Take by  mouth.    . ASPIRIN 81 PO Take by mouth daily.    . cetirizine (ZYRTEC) 10 MG tablet Take 10 mg by mouth daily.    . fish oil-omega-3 fatty acids 1000 MG capsule Take 2 g by mouth daily.    Marland Kitchen FLUZONE QUADRIVALENT 0.5 ML injection ADM 0.5ML IM UTD  0  . Multiple Vitamin (MULTIVITAMIN) tablet Take 1 tablet by mouth daily.     No current facility-administered medications on file prior to visit.    Social History   Socioeconomic History  . Marital status: Married    Spouse name: Not on file  . Number of children: 1  . Years of education: 32  . Highest education level: Not on file  Social Needs  . Financial resource strain: Not on file  . Food insecurity - worry: Not on file  . Food insecurity - inability: Not on file  . Transportation needs - medical: Not on file  . Transportation needs - non-medical: Not on file  Occupational History  . Occupation: [employer].    Comment: fulltime x 18 years  Tobacco Use  . Smoking status: Former Smoker    Packs/day: 0.50    Types: Cigarettes    Last attempt to quit: 1975    Years since quitting: 44.1  . Smokeless tobacco: Never Used  Substance and Sexual Activity  . Alcohol use: Yes    Alcohol/week: 14.4 oz    Types: 24 Cans of beer per week    Comment:    . Drug use: No  . Sexual activity: Yes  Other Topics Concern  . Not on file  Social History Narrative   Marital status:  Married x 43 years.      Children:  1 child Legrand Como), 2 grandchildren.      Lives: with wife.        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  Travels a lot.      Tobacco:  Stopped age 56.      Alcohol: 3-8 beers most nights; more on weekends.  No DWIs.      Drugs:  None      Exercise:  none      Seatbelt: 100%.      Sunscreen:  SPF 15.        Guns:  Several; locked/secured.        Caffeine use: Consumes moderate amount daily.      Smoke alarm in the home.      Family History  Problem Relation Age of  Onset  . Stroke Mother 93  . Heart disease Mother 51       CABG age 3  . Hypertension Mother   . Hyperlipidemia Brother   . Hypertension Brother   . Heart disease Brother 59       AMI  . Heart disease Brother   . Hyperlipidemia Brother   . Hypertension Brother   . COPD Brother   . Mental illness Father   . Cancer Brother        prostate cancer  . Heart disease Maternal Grandmother   . Heart disease Maternal Grandfather   . Hyperlipidemia Maternal Grandfather        Objective:    BP 140/78  Pulse 80   Temp 98 F (36.7 C) (Oral)   Resp 16   Ht 6' 5.95" (1.98 m)   Wt 229 lb (103.9 kg)   SpO2 98%   BMI 26.50 kg/m  Physical Exam  Constitutional: He is oriented to person, place, and time. He appears well-developed and well-nourished. No distress.  HENT:  Head: Normocephalic and atraumatic.  Right Ear: External ear normal.  Left Ear: External ear normal.  Nose: Nose normal.  Mouth/Throat: Oropharynx is clear and moist.  Eyes: Conjunctivae and EOM are normal. Pupils are equal, round, and reactive to light.  Neck: Normal range of motion. Neck supple. Carotid bruit is not present. No thyromegaly present.  Cardiovascular: Normal rate, regular rhythm, normal heart sounds and intact distal pulses. Exam reveals no gallop and no friction rub.  No murmur heard. Pulmonary/Chest: Effort normal and breath sounds normal. He has no wheezes. He has no rales.  Abdominal: Soft. Bowel sounds are normal. He exhibits no distension and no mass. There is no tenderness. There is no rebound and no guarding. Hernia confirmed negative in the right inguinal area and confirmed negative in the left inguinal area.  Genitourinary: Rectum normal, prostate normal, testes normal and penis normal.  Musculoskeletal:       Right shoulder: Normal.       Left shoulder: Normal.       Cervical back: Normal.  Lymphadenopathy:    He has no cervical adenopathy.       Left: No inguinal adenopathy present.    Neurological: He is alert and oriented to person, place, and time. He has normal reflexes. No cranial nerve deficit. He exhibits normal muscle tone. Coordination normal.  Skin: Skin is warm and dry. No rash noted. He is not diaphoretic.  Psychiatric: He has a normal mood and affect. His behavior is normal. Judgment and thought content normal.   No results found. Depression screen [facility] 2/9 07/24/2017 08/24/2016 10/12/2015  Decreased Interest 0 0 0  Down, Depressed, Hopeless 0 0 0  PHQ - 2 Score 0 0 0   Fall Risk  07/24/2017 08/24/2016 10/12/2015  Falls in the past year? No No No        Assessment & Plan:   1. Routine physical examination   2. Seasonal allergic rhinitis due to pollen   3. Family history of prostate cancer   4. Screening, lipid   5. Screening for prostate cancer   6. Screening for diabetes mellitus   7. Screening for thyroid disorder   8. Screening for HIV (human immunodeficiency virus)   9. Need for hepatitis C screening test   10. Need for shingles vaccine   11. Need for Tdap vaccination    -anticipatory guidance provided --- exercise, weight loss, safe driving practices, aspirin 81mg  daily. -obtain age appropriate screening labs and labs for chronic disease management.   Orders Placed This Encounter  Procedures  . Tdap vaccine greater than or equal to 7yo IM  . CBC with Differential/Platelet  . Comprehensive metabolic panel    Order Specific Question:   Has the patient fasted?    Answer:   No  . Hemoglobin A1c  . Lipid panel    Order Specific Question:   Has the patient fasted?    Answer:   No  . TSH  . PSA  . Hepatitis C antibody  . HIV antibody  . POCT urinalysis dipstick   Meds ordered this encounter  Medications  . Zoster Vaccine Adjuvanted [facility]) injection  Sig: Inject 0.5 mLs into the muscle once for 1 dose.    Dispense:  0.5 mL    Refill:  1    Return in about 1 year (around 07/24/2018) for complete physical examiniation.   Violet Seabury  Elayne Guerin, M.D. Primary Care at [facility] previously [facility] [place] [address] [place], [place]  [address] [phone] phone [phone] fax

## 2017-07-25 LAB — CBC WITH DIFFERENTIAL/PLATELET
BASOS: 1 %
Basophils Absolute: 0.1 10*3/uL (ref 0.0–0.2)
EOS (ABSOLUTE): 0.1 10*3/uL (ref 0.0–0.4)
Eos: 2 %
Hematocrit: 47 % (ref 37.5–51.0)
Hemoglobin: 16 g/dL (ref 13.0–17.7)
IMMATURE GRANS (ABS): 0 10*3/uL (ref 0.0–0.1)
Immature Granulocytes: 0 %
LYMPHS ABS: 1.2 10*3/uL (ref 0.7–3.1)
LYMPHS: 19 %
MCH: 31.1 pg (ref 26.6–33.0)
MCHC: 34 g/dL (ref 31.5–35.7)
MCV: 91 fL (ref 79–97)
Monocytes Absolute: 0.5 10*3/uL (ref 0.1–0.9)
Monocytes: 7 %
NEUTROS ABS: 4.4 10*3/uL (ref 1.4–7.0)
Neutrophils: 71 %
PLATELETS: 204 10*3/uL (ref 150–379)
RBC: 5.15 x10E6/uL (ref 4.14–5.80)
RDW: 13.6 % (ref 12.3–15.4)
WBC: 6.2 10*3/uL (ref 3.4–10.8)

## 2017-07-25 LAB — COMPREHENSIVE METABOLIC PANEL
A/G RATIO: 2.2 (ref 1.2–2.2)
ALT: 19 IU/L (ref 0–44)
AST: 20 IU/L (ref 0–40)
Albumin: 5.1 g/dL — ABNORMAL HIGH (ref 3.6–4.8)
Alkaline Phosphatase: 50 IU/L (ref 39–117)
BILIRUBIN TOTAL: 1.1 mg/dL (ref 0.0–1.2)
BUN / CREAT RATIO: 12 (ref 10–24)
BUN: 12 mg/dL (ref 8–27)
CHLORIDE: 101 mmol/L (ref 96–106)
CO2: 22 mmol/L (ref 20–29)
Calcium: 9.6 mg/dL (ref 8.6–10.2)
Creatinine, Ser: 0.97 mg/dL (ref 0.76–1.27)
GFR calc non Af Amer: 83 mL/min/{1.73_m2} (ref >59)
GFR, EST AFRICAN AMERICAN: 96 mL/min/{1.73_m2} (ref >59)
Globulin, Total: 2.3 g/dL (ref 1.5–4.5)
Glucose: 99 mg/dL (ref 65–99)
POTASSIUM: 4.2 mmol/L (ref 3.5–5.2)
Sodium: 143 mmol/L (ref 134–144)
TOTAL PROTEIN: 7.4 g/dL (ref 6.0–8.5)

## 2017-07-25 LAB — LIPID PANEL
CHOL/HDL RATIO: 3.2 ratio (ref 0.0–5.0)
Cholesterol, Total: 242 mg/dL — ABNORMAL HIGH (ref 100–199)
HDL: 75 mg/dL (ref >39)
LDL CALC: 154 mg/dL — AB (ref 0–99)
Triglycerides: 67 mg/dL (ref 0–149)
VLDL Cholesterol Cal: 13 mg/dL (ref 5–40)

## 2017-07-25 LAB — HEMOGLOBIN A1C
Est. average glucose Bld gHb Est-mCnc: 97 mg/dL
Hgb A1c MFr Bld: 5 % (ref 4.8–5.6)

## 2017-07-25 LAB — TSH: TSH: 1.63 u[IU]/mL (ref 0.450–4.500)

## 2017-07-25 LAB — HIV ANTIBODY (ROUTINE TESTING W REFLEX): HIV Screen 4th Generation wRfx: NONREACTIVE

## 2017-07-25 LAB — HEPATITIS C ANTIBODY

## 2017-07-25 LAB — PSA: Prostate Specific Ag, Serum: 5.2 ng/mL — ABNORMAL HIGH (ref 0.0–4.0)

## 2017-08-11 ENCOUNTER — Encounter: Payer: Self-pay | Admitting: Family Medicine

## 2017-08-14 ENCOUNTER — Encounter: Payer: Self-pay | Admitting: Family Medicine

## 2017-08-14 DIAGNOSIS — D225 Melanocytic nevi of trunk: Secondary | ICD-10-CM | POA: Diagnosis not present

## 2017-08-15 NOTE — Telephone Encounter (Signed)
Print form from [place] message; complete and place in my box for me to sign.

## 2017-08-17 NOTE — Telephone Encounter (Signed)
Pt states at his last visit he sent Dr. Tamala Julian some forms to be completed for his insurance and pt has never received these back. He needs them in order to submit to his insurance company. Please contact pt. CB#: [phone]

## 2017-08-20 NOTE — Telephone Encounter (Signed)
Please make copy of forms and mail to patient.

## 2017-08-20 NOTE — Telephone Encounter (Signed)
Please make copy of completed insurance forms and mail to patient.

## 2017-09-12 NOTE — Telephone Encounter (Signed)
Please refax insurance form to employer.  Also, mail a copy to patient.

## 2017-11-14 ENCOUNTER — Encounter: Payer: Self-pay | Admitting: Family Medicine

## 2018-01-29 ENCOUNTER — Encounter: Payer: Self-pay | Admitting: Family Medicine

## 2018-08-29 DIAGNOSIS — Z1329 Encounter for screening for other suspected endocrine disorder: Secondary | ICD-10-CM | POA: Diagnosis not present

## 2018-08-29 DIAGNOSIS — M25511 Pain in right shoulder: Secondary | ICD-10-CM | POA: Diagnosis not present

## 2018-08-29 DIAGNOSIS — Z130 Encounter for screening for diseases of the blood and blood-forming organs and certain disorders involving the immune mechanism: Secondary | ICD-10-CM | POA: Diagnosis not present

## 2018-08-29 DIAGNOSIS — Z13220 Encounter for screening for lipoid disorders: Secondary | ICD-10-CM | POA: Diagnosis not present

## 2018-08-29 DIAGNOSIS — Z125 Encounter for screening for malignant neoplasm of prostate: Secondary | ICD-10-CM | POA: Diagnosis not present

## 2018-08-29 DIAGNOSIS — Z131 Encounter for screening for diabetes mellitus: Secondary | ICD-10-CM | POA: Diagnosis not present

## 2018-08-29 DIAGNOSIS — Z0000 Encounter for general adult medical examination without abnormal findings: Secondary | ICD-10-CM | POA: Diagnosis not present

## 2018-09-14 DIAGNOSIS — M75101 Unspecified rotator cuff tear or rupture of right shoulder, not specified as traumatic: Secondary | ICD-10-CM | POA: Diagnosis not present

## 2019-07-31 DIAGNOSIS — E7800 Pure hypercholesterolemia, unspecified: Secondary | ICD-10-CM | POA: Insufficient documentation

## 2019-09-04 ENCOUNTER — Ambulatory Visit (INDEPENDENT_AMBULATORY_CARE_PROVIDER_SITE_OTHER): Payer: BLUE CROSS/BLUE SHIELD | Admitting: Urology

## 2019-09-04 ENCOUNTER — Encounter: Payer: Self-pay | Admitting: Urology

## 2019-09-04 ENCOUNTER — Other Ambulatory Visit: Payer: Self-pay

## 2019-09-04 VITALS — BP 162/89 | HR 73 | Ht 78.0 in | Wt 220.0 lb

## 2019-09-04 DIAGNOSIS — Z8042 Family history of malignant neoplasm of prostate: Secondary | ICD-10-CM | POA: Diagnosis not present

## 2019-09-04 DIAGNOSIS — N400 Enlarged prostate without lower urinary tract symptoms: Secondary | ICD-10-CM | POA: Diagnosis not present

## 2019-09-04 DIAGNOSIS — R972 Elevated prostate specific antigen [PSA]: Secondary | ICD-10-CM | POA: Insufficient documentation

## 2019-09-04 NOTE — Progress Notes (Signed)
09/04/2019 10:41 AM   Ian Campos [birth year] [id number]  Referring provider: Wardell Honour, MD [place],  [place] [address]  Chief Complaint  Patient presents with  . Elevated PSA    HPI: Ian Campos is a 64 yo male seen at the request of Dr. Tamala Julian for evaluation of an elevated PSA.  -PSA results available back to 2018 as below -Most recent PSA was lowest at 4.34 -Family history prostate cancer in brother; diagnosed around age 62 and underwent radical prostatectomy -No bothersome lower urinary tract symptoms -Mild ED but not interested in treatment -No dysuria, gross hematuria -No flank, abdominal or pelvic pain        PMH: Past Medical History:  Diagnosis Date  . Allergy    Nasonex  . Benign paroxysmal positional vertigo    x1. 5-6 yrs ago  . Elevated blood pressure reading without diagnosis of hypertension   . Erectile dysfunction   . Family history of adverse reaction to anesthesia    Mother - slow to wake  . Gilbert's disease   . Nonspecific abnormal finding in stool contents     Surgical History: Past Surgical History:  Procedure Laterality Date  . COLONOSCOPY  04/24/2007   normal.  [facility]ohl.  . COLONOSCOPY WITH PROPOFOL N/A 02/24/2017   Procedure: COLONOSCOPY WITH PROPOFOL;  Surgeon: Lucilla Lame, MD;  Location: [facility];  Service: Endoscopy;  Laterality: N/A;  . EYE SURGERY     Lasik  . POLYPECTOMY  02/24/2017   Procedure: POLYPECTOMY;  Surgeon: Lucilla Lame, MD;  Location: [facility];  Service: Endoscopy;;  . TREATMENT FISTULA ANAL  06/20/1978  . VASECTOMY      Home Medications:  Allergies as of 09/04/2019      Reactions   Other    Had reaction as a child to one of the "mycins". Does not know which one, or reaction   Penicillins    Childhood reaction      Medication List       Accurate as of September 04, 2019 10:41 AM. If you have any questions, ask your nurse or doctor.        ASPIRIN 81 PO Take  by mouth daily.   atorvastatin 10 MG tablet Commonly known as: LIPITOR Take by mouth.   cetirizine 10 MG tablet Commonly known as: ZYRTEC Take 10 mg by mouth daily.   fish oil-omega-3 fatty acids 1000 MG capsule Take 2 g by mouth daily.   Fluzone Quadrivalent 0.5 ML injection Generic drug: influenza vac split quadrivalent PF ADM 0.5ML IM UTD   multivitamin tablet Take 1 tablet by mouth daily.   triamcinolone 55 MCG/ACT Aero nasal inhaler Commonly known as: NASACORT Place into the nose.   VITAMIN C PO Take by mouth.       Allergies:  Allergies  Allergen Reactions  . Other     Had reaction as a child to one of the "mycins". Does not know which one, or reaction  . Penicillins     Childhood reaction    Family History: Family History  Problem Relation Age of Onset  . Stroke Mother 49  . Heart disease Mother 33       CABG age 51  . Hypertension Mother   . Hyperlipidemia Brother   . Hypertension Brother   . Heart disease Brother 23       AMI  . Heart disease Brother   . Hyperlipidemia Brother   . Hypertension Brother   .  COPD Brother   . Mental illness Father   . Cancer Brother        prostate cancer  . Heart disease Maternal Grandmother   . Heart disease Maternal Grandfather   . Hyperlipidemia Maternal Grandfather     Social History:  reports that he quit smoking about 46 years ago. His smoking use included cigarettes. He smoked 0.50 packs per day. He has never used smokeless tobacco. He reports current alcohol use of about 24.0 standard drinks of alcohol per week. He reports that he does not use drugs.   Physical Exam: BP (!) 162/89 (BP Location: Left Arm, Patient Position: Sitting, Cuff Size: Large)   Pulse 73   Ht 6\' 6"  (1.981 m)   Wt 220 lb (99.8 kg)   BMI 25.42 kg/m   Constitutional:  Alert and oriented, No acute distress. HEENT: [place] AT, moist mucus membranes.  Trachea midline, no masses. Cardiovascular: No clubbing, cyanosis, or  edema. Respiratory: Normal respiratory effort, no increased work of breathing. GI: Abdomen is soft, nontender, nondistended, no abdominal masses GU: Prostate 40 g, smooth without nodules Skin: No rashes, bruises or suspicious lesions. Neurologic: Grossly intact, no focal deficits, moving all 4 extremities. Psychiatric: Normal mood and affect.   Assessment & Plan:    - Elevated PSA Mild PSA elevation and benign DRE.  Most recent PSA is the lowest since 2018.  It was discussed that statistically his chances of prostate cancer are around 20% though the chances of clinically significant prostate cancer (Gleason 7 or >) is 4-5%.  Although PSA is a prostate cancer screening test he was informed that cancer is not the most common cause of an elevated PSA. Other potential causes including BPH and inflammation were discussed. He was informed that the only way to adequately diagnose prostate cancer would be a transrectal ultrasound and biopsy of the prostate. The procedure was discussed including potential risks of bleeding and infection/sepsis. He was also informed that a negative biopsy does not conclusively rule out the possibility that prostate cancer may be present and that continued monitoring is required. The use of newer adjunctive blood tests including PHI and 4kScore were discussed. The use of multiparametric prostate MRI was also discussed including the value of fusion biopsy if suspicious lesions were identified. Continued periodic surveillance was also discussed.  I initially recommended a prostate MRI.  - Family history prostate cancer As above.   Abbie Sons, [facility] [address], [place], [place] [address] [phone]

## 2019-09-24 ENCOUNTER — Ambulatory Visit
Admission: RE | Admit: 2019-09-24 | Discharge: 2019-09-24 | Disposition: A | Discharge status: Home / Self Care | Payer: BC Managed Care – PPO | Source: Ambulatory Visit | Attending: Urology | Admitting: Urology

## 2019-09-24 ENCOUNTER — Other Ambulatory Visit: Payer: Self-pay

## 2019-09-24 DIAGNOSIS — R972 Elevated prostate specific antigen [PSA]: Secondary | ICD-10-CM | POA: Diagnosis present

## 2019-09-24 DIAGNOSIS — Z8042 Family history of malignant neoplasm of prostate: Secondary | ICD-10-CM | POA: Diagnosis present

## 2019-09-24 LAB — POCT I-STAT CREATININE: Creatinine, Ser: 1 mg/dL (ref 0.61–1.24)

## 2019-09-24 MED ORDER — GADOBUTROL 1 MMOL/ML IV SOLN
10.0000 mL | Freq: Once | INTRAVENOUS | Status: AC | PRN
  Administered 2019-09-24: 14:00:00 10 mL via INTRAVENOUS

## 2019-09-27 ENCOUNTER — Telehealth: Payer: Self-pay | Admitting: Urology

## 2019-09-27 DIAGNOSIS — R972 Elevated prostate specific antigen [PSA]: Secondary | ICD-10-CM

## 2019-09-27 DIAGNOSIS — R935 Abnormal findings on diagnostic imaging of other abdominal regions, including retroperitoneum: Secondary | ICD-10-CM

## 2019-09-27 NOTE — Telephone Encounter (Signed)
I contacted Ian Campos today regarding his prostate MRI.  Prostate volume was 62 g.  There was a PI-RADS 4 lesion in the left mid peripheral zone and a PI-RADS 3 lesion in the right lateral apex PZ.  No abnormalities of the seminal vesicles, capsule or neurovascular bundle.  No pelvic adenopathy  Findings were discussed in detail and I recommended scheduling a fusion biopsy at [facility] in [place].  The procedure was cussed with control risks of bleeding and infection/sepsis.  He desires to proceed and will place referral.

## 2019-10-21 ENCOUNTER — Other Ambulatory Visit: Payer: Self-pay | Admitting: Urology

## 2019-10-23 ENCOUNTER — Encounter: Payer: Self-pay | Admitting: Urology

## 2019-10-23 ENCOUNTER — Telehealth (INDEPENDENT_AMBULATORY_CARE_PROVIDER_SITE_OTHER): Payer: BC Managed Care – PPO | Admitting: Urology

## 2019-10-23 ENCOUNTER — Other Ambulatory Visit: Payer: Self-pay

## 2019-10-23 DIAGNOSIS — R972 Elevated prostate specific antigen [PSA]: Secondary | ICD-10-CM | POA: Diagnosis not present

## 2019-10-23 NOTE — Progress Notes (Signed)
Virtual Visit via Telephone Note  I connected with Ian Campos on 10/23/19 at  8:00 AM EDT by telephone and verified that I am speaking with the correct person using two identifiers.  Location: Patient: Home Provider: [facility]   I discussed the limitations, risks, security and privacy concerns of performing an evaluation and management service by telephone and the availability of in person appointments. I also discussed with the patient that there may be a patient responsible charge related to this service. The patient expressed understanding and agreed to proceed.   History of Present Illness: 64 y.o. with an elevated PSA in the low 4-low 5 range.  Family history prostate cancer Prostate MRI with a PI-RADS 4 lesion left mid and PI-RADS 3  right lateral apex.  Prostate volume 62 cc.  Fusion biopsy performed at [facility] 10/16/2019.  Prostate volume 70 g.  He had no post biopsy complaints.  Pathology: ROI biopsies 3 per lesion were all benign.  4/12 biopsies positive for adenocarcinoma prostate; 3 were Gleason 3+4.     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  No further staging evaluation needed with prostate MRI showing no extracapsular disease or pelvic adenopathy.  Would not recommend surveillance for intermediate risk disease and curative treatment options were discussed including radical prostatectomy and radiation modalities.  I offered him an appointment in radiation oncology for more information however he would like to think over these options before deciding.  Indicated he would call back.    Follow Up Instructions: As above   I discussed the assessment and treatment plan with the patient. The patient was provided an opportunity to ask questions and all were answered. The patient agreed with the plan and demonstrated an understanding of the  instructions.   The patient was advised to call back or seek an in-person evaluation if the symptoms worsen or if the condition fails to improve as anticipated.  I provided 15 minutes of non-face-to-face time during this encounter.   Abbie Sons, MD

## 2019-10-24 ENCOUNTER — Telehealth: Payer: Self-pay

## 2019-10-24 NOTE — Telephone Encounter (Signed)
Pt calls and questions if it is ok to resume his baby asa daily and vitamins. Advised pt he is ok to resume as procedure a was a week ago. Pt voiced understanding.

## 2019-10-29 ENCOUNTER — Telehealth: Payer: Self-pay | Admitting: *Deleted

## 2019-10-29 DIAGNOSIS — C61 Malignant neoplasm of prostate: Secondary | ICD-10-CM | POA: Insufficient documentation

## 2019-10-29 NOTE — Addendum Note (Signed)
Addended by: Abbie Sons on: 10/29/2019 02:13 PM   Modules accepted: Orders

## 2019-10-29 NOTE — Telephone Encounter (Signed)
Patient would like to be referred to  radiation oncology  . (seeds)

## 2019-11-13 ENCOUNTER — Ambulatory Visit
Admission: RE | Admit: 2019-11-13 | Discharge: 2019-11-13 | Disposition: A | Discharge status: Home / Self Care | Payer: BC Managed Care – PPO | Source: Ambulatory Visit | Attending: Radiation Oncology | Admitting: Radiation Oncology

## 2019-11-13 ENCOUNTER — Other Ambulatory Visit: Payer: Self-pay

## 2019-11-13 DIAGNOSIS — H8110 Benign paroxysmal vertigo, unspecified ear: Secondary | ICD-10-CM | POA: Insufficient documentation

## 2019-11-13 DIAGNOSIS — C61 Malignant neoplasm of prostate: Secondary | ICD-10-CM | POA: Insufficient documentation

## 2019-11-13 DIAGNOSIS — I10 Essential (primary) hypertension: Secondary | ICD-10-CM | POA: Insufficient documentation

## 2019-11-13 NOTE — Consult Note (Signed)
NEW PATIENT EVALUATION  Name: Ian Campos  MRN: [id number]  Date:   11/13/2019     DOB: 19-Oct-1955   This 64 y.o. 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  5.2  REFERRING PHYSICIAN: Wardell Honour, MD  CHIEF COMPLAINT:  Chief Complaint  Patient presents with  . Prostate Cancer    DIAGNOSIS: The encounter diagnosis was Prostate cancer ([place]).   PREVIOUS INVESTIGATIONS:  MRI of prostate reviewed Clinical notes reviewed Pathology report reviewed  HPI: Patient is a 64 year old male who presented with a slow slowly rising PSA most recently 5.2.  Patient had an MRI of his prostate back in April showing bilateral small peripheral zones multiparametric signal abnormalities suspicious for small volume possibly high-grade disease.  This was PI-RADS 4.  No evidence of locally mass disease extracapsular extension or pelvic metastatic disease was noted.  4 of 12 cores were positive for mostly Gleason 7 (3+4) adenocarcinoma.  Fusion biopsies were benign.  Patient's prostate by clinical exam and MRI scan ranges between 60 to cc and 70 cc.  Patient has seen Dr. Bernardo Heater and discuss treatment options.  He is now referred to radiation oncology for consideration of treatment.  He is doing fairly well has very few increased lower urinary tract symptoms no bowel problems.  PLANNED TREATMENT REGIMEN: I-125 interstitial implant  PAST MEDICAL HISTORY:  has a past medical history of Allergy, Benign paroxysmal positional vertigo, Elevated blood pressure reading without diagnosis of hypertension, Erectile dysfunction, Family history of adverse reaction to anesthesia, Gilbert's disease, and Nonspecific abnormal finding in stool contents.    PAST SURGICAL HISTORY:  Past Surgical History:  Procedure Laterality Date  . COLONOSCOPY  04/24/2007   normal.  Wohl.  . COLONOSCOPY WITH PROPOFOL N/A  02/24/2017   Procedure: COLONOSCOPY WITH PROPOFOL;  Surgeon: Lucilla Lame, MD;  Location: [facility];  Service: Endoscopy;  Laterality: N/A;  . EYE SURGERY     Lasik  . POLYPECTOMY  02/24/2017   Procedure: POLYPECTOMY;  Surgeon: Lucilla Lame, MD;  Location: [facility];  Service: Endoscopy;;  . TREATMENT FISTULA ANAL  06/20/1978  . VASECTOMY      FAMILY HISTORY: family history includes COPD in his brother; Cancer in his brother; Heart disease in his brother, maternal grandfather, and maternal grandmother; Heart disease (age of onset: 53) in his brother; Heart disease (age of onset: 31) in his mother; Hyperlipidemia in his brother, brother, and maternal grandfather; Hypertension in his brother, brother, and mother; Mental illness in his father; Stroke (age of onset: 19) in his mother.  SOCIAL HISTORY:  reports that he quit smoking about 46 years ago. His smoking use included cigarettes. He smoked 0.50 packs per day. He has never used smokeless tobacco. He reports current alcohol use of about 24.0 standard drinks of alcohol per week. He reports that he does not use drugs.  ALLERGIES: Other and Penicillins  MEDICATIONS:  Current Outpatient Medications  Medication Sig Dispense Refill  . Ascorbic Acid (VITAMIN C PO) Take by mouth.    . ASPIRIN 81 PO Take by mouth daily.    Marland Kitchen atorvastatin (LIPITOR) 10 MG tablet Take by mouth.    . cetirizine (ZYRTEC) 10 MG tablet Take 10 mg by mouth daily.    . fish oil-omega-3 fatty acids 1000 MG capsule Take 2 g by mouth daily.    Marland Kitchen FLUZONE QUADRIVALENT 0.5 ML injection  ADM 0.5ML IM UTD  0  . Multiple Vitamin (MULTIVITAMIN) tablet Take 1 tablet by mouth daily.    Marland Kitchen triamcinolone (NASACORT) 55 MCG/ACT AERO nasal inhaler Place into the nose.     No current facility-administered medications for this encounter.    ECOG PERFORMANCE STATUS:  0 - Asymptomatic  REVIEW OF SYSTEMS: Patient denies any weight loss, fatigue, weakness, fever, chills or  night sweats. Patient denies any loss of vision, blurred vision. Patient denies any ringing  of the ears or hearing loss. No irregular heartbeat. Patient denies heart murmur or history of fainting. Patient denies any chest pain or pain radiating to her upper extremities. Patient denies any shortness of breath, difficulty breathing at night, cough or hemoptysis. Patient denies any swelling in the lower legs. Patient denies any nausea vomiting, vomiting of blood, or coffee ground material in the vomitus. Patient denies any stomach pain. Patient states has had normal bowel movements no significant constipation or diarrhea. Patient denies any dysuria, hematuria or significant nocturia. Patient denies any problems walking, swelling in the joints or loss of balance. Patient denies any skin changes, loss of hair or loss of weight. Patient denies any excessive worrying or anxiety or significant depression. Patient denies any problems with insomnia. Patient denies excessive thirst, polyuria, polydipsia. Patient denies any swollen glands, patient denies easy bruising or easy bleeding. Patient denies any recent infections, allergies or URI. Patient "s visual fields have not changed significantly in recent time.   PHYSICAL EXAM: There were no vitals taken for this visit. Well-developed well-nourished patient in NAD. HEENT reveals PERLA, EOMI, discs not visualized.  Oral cavity is clear. No oral mucosal lesions are identified. Neck is clear without evidence of cervical or supraclavicular adenopathy. Lungs are clear to A&P. Cardiac examination is essentially unremarkable with regular rate and rhythm without murmur rub or thrill. Abdomen is benign with no organomegaly or masses noted. Motor sensory and DTR levels are equal and symmetric in the upper and lower extremities. Cranial nerves II through XII are grossly intact. Proprioception is intact. No peripheral adenopathy or edema is identified. No motor or sensory levels are  noted. Crude visual fields are within normal range.  LABORATORY DATA: Pathology reports reviewed PSAs reviewed    RADIOLOGY RESULTS: MRI of prostate reviewed compatible with above-stated findings   IMPRESSION: Stage IIa adenocarcinoma of the prostate in 64 year old male.  PLAN: At this time I have recommended definitive treatment for his prostate cancer based on the Gleason 7 score.  Treatment options including robotic prostatectomy external beam radiation therapy and I-125 interstitial implant were discussed.  Patient's parameters analyzed by the [facility] nomogram shows only a 3% chance of lymph node involvement 65% chance of organ confined disease.  We have discussed the ability to salvage patient after prostatectomy with radiation as well as the [place] not being true as there would be no salvage after radiation.  We discussed the risks and benefits of treatment including erectile dysfunction, incontinence fatigue alteration of blood counts.  We also discussed radiation safety precautions with an I-125 interstitial implant.  Prostate is above the limit for prostate size for I-125 interstitial implant although we have discussed starting the patient on Eligard and reevaluating him in 3 months at which time I believe he will have enough shrinkage of his gland to go ahead with implant.  Patient at this time is leaning towards that treatment plan.  We have referred him back to Dr. Clydene Laming office for an Eligard injection and will reevaluate  him in 3 months.  Patient and wife both seem to comprehend my treatment plan well.  I would like to take this opportunity to thank you for allowing me to participate in the care of your patient.Noreene Filbert, MD

## 2019-11-15 ENCOUNTER — Telehealth: Payer: Self-pay | Admitting: Family Medicine

## 2019-11-15 ENCOUNTER — Telehealth: Payer: Self-pay

## 2019-11-15 DIAGNOSIS — C61 Malignant neoplasm of prostate: Secondary | ICD-10-CM

## 2019-11-15 NOTE — Telephone Encounter (Signed)
Forms faxed to [facility] to initiate PA for Lupron/Eligard.

## 2019-11-15 NOTE — Telephone Encounter (Signed)
Patient called stating he has more questions after talking to Dr. Donella Stade and he wants to discuss more about treatment options before beginning any treatments.

## 2019-11-20 ENCOUNTER — Other Ambulatory Visit: Payer: Self-pay

## 2019-11-20 ENCOUNTER — Telehealth (INDEPENDENT_AMBULATORY_CARE_PROVIDER_SITE_OTHER): Payer: BC Managed Care – PPO | Admitting: Urology

## 2019-11-20 DIAGNOSIS — C61 Malignant neoplasm of prostate: Secondary | ICD-10-CM | POA: Diagnosis not present

## 2019-11-20 NOTE — Progress Notes (Signed)
This service is provided via telemedicine   No vital signs collected/recorded due to the encounter was a telemedicine visit.     Patient consents to a telephone visit:  Yes      Names of all persons participating in the telemedicine service and their role in the encounter: Gaspar Cola CMA

## 2019-11-20 NOTE — Progress Notes (Signed)
Virtual Visit via Video Note  I connected with Ian Campos on 11/20/2019 at  2:15 PM EDT by a video enabled telemedicine application and verified that I am speaking with the correct person using two identifiers.  Location: Patient: Home Provider: Office   I discussed the limitations of evaluation and management by telemedicine and the availability of in person appointments. The patient expressed understanding and agreed to proceed.  History of Present Illness: 64 y.o. male with recently diagnosed intermediate risk prostate cancer.  We discussed the diagnosis and management options at a visit in 10/23/2019.  He was primarily interested in radiation therapy and saw Dr. Baruch Gouty on 11/13/2019 however had several follow-up questions.  He states Dr. Baruch Gouty discussed brachytherapy and was concerned about size discrepancy on MRI versus ultrasound.  His prostate volume by ultrasound was 70 cc and 62 cc by MRI.  It was felt that ADT for volume reduction would be needed for brachytherapy.  He is unclear if IMRT was discussed.  He now may be interested in radical prostatectomy.   Observations/Objective: Alert, conversive  Assessment and Plan:  1. cT1c intermediate risk adenocarcinoma prostate Prostate size was fairly close by MRI and TRUS.  He inquired if ADT would be needed for radical prostatectomy and was informed it would not.  He is presently undecided at this time and I offered him an appointment with my partners here who performed RALP however he thinks he may be interested in a second opinion at [facility].  Follow Up Instructions: He inquired if myself or Dr. Tamala Julian would need to make the referral to [facility] and I told him I would be happy to make the referral.  He indicated he would check with Dr. Tamala Julian since she is in the [facility] system.  He will call back with any questions   I discussed the assessment and treatment plan with the patient. The patient was provided an opportunity to ask questions  and all were answered. The patient agreed with the plan and demonstrated an understanding of the instructions.   The patient was advised to call back or seek an in-person evaluation if the symptoms worsen or if the condition fails to improve as anticipated.  I provided 15 minutes of non-face-to-face time during this encounter.   Abbie Sons, MD

## 2019-11-22 ENCOUNTER — Encounter: Payer: Self-pay | Admitting: Urology

## 2019-11-25 NOTE — Telephone Encounter (Signed)
He is reluctant to start leuprolide and wanted an appointment with urology department at [facility] to discuss radical prostatectomy.  I put in the referral.  Thanks

## 2019-11-25 NOTE — Telephone Encounter (Signed)
Eligard/Lupron approved PA approval [id number]. Approved 11/25/2019 - 11/24/2021. Appointment scheduled.

## 2019-11-25 NOTE — Addendum Note (Signed)
Addended by: John Giovanni C on: 11/25/2019 11:21 AM   Modules accepted: Orders

## 2019-11-25 NOTE — Telephone Encounter (Signed)
Called pt informed him of PA for Eligard. Pt states that he would like to wait on second opinion from [facility]. Pt requested that our office place referral as he has not been able to reach Dr. Tamala Julian.

## 2020-02-12 ENCOUNTER — Ambulatory Visit: Payer: BC Managed Care – PPO | Admitting: Radiation Oncology

## 2022-07-14 ENCOUNTER — Ambulatory Visit
Admission: RE | Admit: 2022-07-14 | Discharge: 2022-07-14 | Disposition: A | Discharge status: Home / Self Care | Payer: BC Managed Care – PPO | Source: Ambulatory Visit | Attending: Family Medicine | Admitting: Family Medicine

## 2022-07-14 ENCOUNTER — Other Ambulatory Visit: Payer: Self-pay | Admitting: Family Medicine

## 2022-07-14 DIAGNOSIS — R109 Unspecified abdominal pain: Secondary | ICD-10-CM

## 2022-07-14 MED ORDER — IOHEXOL 300 MG/ML  SOLN
100.0000 mL | Freq: Once | INTRAMUSCULAR | Status: AC | PRN
  Administered 2022-07-14: 100 mL via INTRAVENOUS

## 2022-10-06 ENCOUNTER — Other Ambulatory Visit: Payer: Self-pay | Admitting: *Deleted

## 2022-10-06 ENCOUNTER — Telehealth: Payer: Self-pay | Admitting: Gastroenterology

## 2022-10-06 ENCOUNTER — Telehealth: Payer: Self-pay | Admitting: *Deleted

## 2022-10-06 DIAGNOSIS — Z86010 Personal history of colonic polyps: Secondary | ICD-10-CM

## 2022-10-06 MED ORDER — NA SULFATE-K SULFATE-MG SULF 17.5-3.13-1.6 GM/177ML PO SOLN: 1.0000 | Freq: Once | ORAL | 0 refills | Status: AC

## 2022-10-06 NOTE — Telephone Encounter (Addendum)
Schedule colonoscopy 11/17/2022 with Dr Servando Snare at [facility]

## 2022-10-06 NOTE — Telephone Encounter (Signed)
Gastroenterology Pre-Procedure Review  Request Date: 5/302024 Requesting Physician: Dr. Servando Snare  PATIENT REVIEW QUESTIONS: The patient responded to the following health history questions as indicated:    1. Are you having any GI issues? no 2. Do you have a personal history of Polyps? no 3. Do you have a family history of Colon Cancer or Polyps? no 4. Diabetes Mellitus? no 5. Joint replacements in the past 12 months?no 6. Major health problems in the past 3 months?no 7. Any artificial heart valves, MVP, or defibrillator?no    MEDICATIONS & ALLERGIES:    Patient reports the following regarding taking any anticoagulation/antiplatelet therapy:   Plavix, Coumadin, Eliquis, Xarelto, Lovenox, Pradaxa, Brilinta, or Effient? no Aspirin? yes (81 mg)  Patient confirms/reports the following medications:  Current Outpatient Medications  Medication Sig Dispense Refill   Na Sulfate-K Sulfate-Mg Sulf 17.5-3.13-1.6 GM/177ML SOLN Take 1 kit by mouth once for 1 dose. 354 mL 0   Ascorbic Acid (VITAMIN C PO) Take by mouth.     ASPIRIN 81 PO Take by mouth daily.     atorvastatin (LIPITOR) 10 MG tablet Take by mouth.     cetirizine (ZYRTEC) 10 MG tablet Take 10 mg by mouth daily.     fish oil-omega-3 fatty acids 1000 MG capsule Take 2 g by mouth daily.     FLUZONE QUADRIVALENT 0.5 ML injection ADM 0.5ML IM UTD  0   Multiple Vitamin (MULTIVITAMIN) tablet Take 1 tablet by mouth daily.     No current facility-administered medications for this visit.    Patient confirms/reports the following allergies:  Allergies  Allergen Reactions   Other     Had reaction as a child to one of the "mycins". Does not know which one, or reaction   Penicillins     Childhood reaction    No orders of the defined types were placed in this encounter.   AUTHORIZATION INFORMATION Primary Insurance: 1D#: Group #:  Secondary Insurance: 1D#: Group #:  SCHEDULE INFORMATION: Date: 11/17/2022 Time: Location: [facility]

## 2022-10-06 NOTE — Telephone Encounter (Signed)
Patient calling to schedule repeat colonoscopy. Requesting call back. 

## 2022-11-09 ENCOUNTER — Encounter: Payer: Self-pay | Admitting: Gastroenterology

## 2022-11-09 ENCOUNTER — Other Ambulatory Visit: Payer: Self-pay

## 2022-11-09 NOTE — Anesthesia Preprocedure Evaluation (Addendum)
Anesthesia Evaluation  Patient identified by MRN, date of birth, ID band Patient awake    Reviewed: Allergy & Precautions, H&P , NPO status , Patient's Chart, lab work & pertinent test results  History of Anesthesia Complications (+) Family history of anesthesia reaction  Airway Mallampati: II  TM Distance: >3 FB Neck ROM: Full    Dental  (+) Caps Lots of caps and a permanent bridge:   Pulmonary neg pulmonary ROS, former smoker   Pulmonary exam normal breath sounds clear to auscultation       Cardiovascular negative cardio ROS Normal cardiovascular exam Rhythm:Regular Rate:Normal     Neuro/Psych negative neurological ROS  negative psych ROS   GI/Hepatic negative GI ROS, Neg liver ROS,,,  Endo/Other  negative endocrine ROS    Renal/GU negative Renal ROS  negative genitourinary   Musculoskeletal  (+) Arthritis ,    Abdominal   Peds negative pediatric ROS (+)  Hematology negative hematology ROS (+)   Anesthesia Other Findings Gilbert's disease  Allergy Erectile dysfunction  Nonspecific abnormal finding in stool contents Elevated blood pressure reading without diagnosis of hypertension  Benign paroxysmal positional vertigo Arthritis  Had ruptured, gangrenous appendix and partial colectomy few months ago, doing well now Status post prostatectomy  Mother slow to awaken from anesthesia    Reproductive/Obstetrics negative OB ROS                             Anesthesia Physical Anesthesia Plan  ASA: 2  Anesthesia Plan:    Post-op Pain Management:    Induction:   PONV Risk Score and Plan:   Airway Management Planned:   Additional Equipment:   Intra-op Plan:   Post-operative Plan:   Informed Consent:   Plan Discussed with:   Anesthesia Plan Comments:        Anesthesia Quick Evaluation

## 2022-11-17 ENCOUNTER — Ambulatory Visit: Payer: BC Managed Care – PPO | Admitting: Anesthesiology

## 2022-11-17 ENCOUNTER — Other Ambulatory Visit: Payer: Self-pay | Admitting: Gastroenterology

## 2022-11-17 ENCOUNTER — Encounter: Payer: Self-pay | Admitting: Gastroenterology

## 2022-11-17 ENCOUNTER — Ambulatory Visit
Admission: RE | Admit: 2022-11-17 | Discharge: 2022-11-17 | Disposition: A | Discharge status: Home / Self Care | Payer: BC Managed Care – PPO | Attending: Gastroenterology | Admitting: Gastroenterology

## 2022-11-17 ENCOUNTER — Other Ambulatory Visit: Payer: Self-pay

## 2022-11-17 ENCOUNTER — Encounter
Admission: RE | Disposition: A | Discharge status: Home / Self Care | Payer: Self-pay | Source: Home / Self Care | Attending: Gastroenterology

## 2022-11-17 DIAGNOSIS — Z860100 Personal history of colon polyps, unspecified: Secondary | ICD-10-CM

## 2022-11-17 DIAGNOSIS — K641 Second degree hemorrhoids: Secondary | ICD-10-CM | POA: Diagnosis not present

## 2022-11-17 DIAGNOSIS — D125 Benign neoplasm of sigmoid colon: Secondary | ICD-10-CM | POA: Diagnosis not present

## 2022-11-17 DIAGNOSIS — Z87891 Personal history of nicotine dependence: Secondary | ICD-10-CM | POA: Diagnosis not present

## 2022-11-17 DIAGNOSIS — K635 Polyp of colon: Secondary | ICD-10-CM

## 2022-11-17 DIAGNOSIS — Z1211 Encounter for screening for malignant neoplasm of colon: Secondary | ICD-10-CM | POA: Insufficient documentation

## 2022-11-17 DIAGNOSIS — K5730 Diverticulosis of large intestine without perforation or abscess without bleeding: Secondary | ICD-10-CM | POA: Diagnosis not present

## 2022-11-17 DIAGNOSIS — Z79899 Other long term (current) drug therapy: Secondary | ICD-10-CM | POA: Diagnosis not present

## 2022-11-17 DIAGNOSIS — D122 Benign neoplasm of ascending colon: Secondary | ICD-10-CM | POA: Diagnosis not present

## 2022-11-17 HISTORY — PX: COLONOSCOPY WITH PROPOFOL: SHX5780

## 2022-11-17 HISTORY — DX: Acquired absence of other genital organ(s): Z90.79

## 2022-11-17 HISTORY — DX: Unspecified osteoarthritis, unspecified site: M19.90

## 2022-11-17 SURGERY — COLONOSCOPY WITH PROPOFOL
Anesthesia: Choice

## 2022-11-17 MED ORDER — PROPOFOL 10 MG/ML IV BOLUS
INTRAVENOUS | Status: DC | PRN
  Administered 2022-11-17: 40 mg via INTRAVENOUS
  Administered 2022-11-17: 20 mg via INTRAVENOUS
  Administered 2022-11-17: 100 mg via INTRAVENOUS
  Administered 2022-11-17 (×2): 50 mg via INTRAVENOUS

## 2022-11-17 MED ORDER — LACTATED RINGERS IV SOLN: INTRAVENOUS | Status: DC

## 2022-11-17 MED ORDER — LIDOCAINE HCL (CARDIAC) PF 100 MG/5ML IV SOSY
PREFILLED_SYRINGE | INTRAVENOUS | Status: DC | PRN
  Administered 2022-11-17: 50 mg via INTRAVENOUS

## 2022-11-17 MED ORDER — SODIUM CHLORIDE 0.9 % IV SOLN: INTRAVENOUS | Status: DC

## 2022-11-17 MED ORDER — STERILE WATER FOR IRRIGATION IR SOLN
Status: DC | PRN
  Administered 2022-11-17: 1000 mL

## 2022-11-17 MED ORDER — STERILE WATER FOR IRRIGATION IR SOLN: Status: DC | PRN

## 2022-11-17 SURGICAL SUPPLY — 17 items
ELECT REM PT RETURN 9FT ADLT (ELECTROSURGICAL)
ELECTRODE REM PT RTRN 9FT ADLT (ELECTROSURGICAL) IMPLANT
FORCEPS BIOP RAD 4 LRG CAP 4 (CUTTING FORCEPS) IMPLANT
GOWN CVR UNV OPN BCK APRN NK (MISCELLANEOUS) ×2 IMPLANT
GOWN ISOL THUMB LOOP REG UNIV (MISCELLANEOUS) ×2
INJECTOR VARIJECT VIN23 (MISCELLANEOUS) IMPLANT
KIT DEFENDO VALVE AND CONN (KITS) IMPLANT
KIT PRC NS LF DISP ENDO (KITS) ×1 IMPLANT
KIT PROCEDURE OLYMPUS (KITS) ×1
MANIFOLD NEPTUNE II (INSTRUMENTS) ×1 IMPLANT
MARKER SPOT ENDO TATTOO 5ML (MISCELLANEOUS) IMPLANT
RETRIEVER NET ROTH 2.5X230 LF (MISCELLANEOUS) IMPLANT
SNARE COLD EXACTO (MISCELLANEOUS) IMPLANT
SNARE SHORT THROW 13M SML OVAL (MISCELLANEOUS) IMPLANT
TRAP ETRAP POLY (MISCELLANEOUS) IMPLANT
VARIJECT INJECTOR VIN23 (MISCELLANEOUS)
WATER STERILE IRR 250ML POUR (IV SOLUTION) ×1 IMPLANT

## 2022-11-17 NOTE — Op Note (Signed)
[facility] Patient Name: Ian Campos Procedure Date: 11/17/2022 11:19 AM MRN: [id number] Account #: [id number] Date of Birth: 29-Dec-1955 Admit Type: Outpatient Age: 67 Room: [facility] OR ROOM 01 Gender: Male Note Status: Finalized Instrument Name: [id number] Procedure:             Colonoscopy Indications:           High risk colon cancer surveillance: Personal history                         of colonic polyps Providers:             Midge Minium MD, MD Referring MD:          Midge Minium MD, MD (Referring MD), Myrle Sheng. Katrinka Blazing, MD                         (Referring MD) Medicines:             Propofol per Anesthesia Complications:         No immediate complications. Procedure:             Pre-Anesthesia Assessment:                        - Prior to the procedure, a History and Physical was                         performed, and patient medications and allergies were                         reviewed. The patient's tolerance of previous                         anesthesia was also reviewed. The risks and benefits                         of the procedure and the sedation options and risks                         were discussed with the patient. All questions were                         answered, and informed consent was obtained. Prior                         Anticoagulants: The patient has taken no anticoagulant                         or antiplatelet agents. ASA Grade Assessment: II - A                         patient with mild systemic disease. After reviewing                         the risks and benefits, the patient was deemed in                         satisfactory condition to undergo the procedure.  After obtaining informed consent, the colonoscope was                         passed under direct vision. Throughout the procedure,                         the patient's blood pressure, pulse, and oxygen                          saturations were monitored continuously. The                         Colonoscope was introduced through the anus and                         advanced to the the cecum, identified by appendiceal                         orifice and ileocecal valve. The colonoscopy was                         performed without difficulty. The patient tolerated                         the procedure well. The quality of the bowel                         preparation was excellent. Findings:      The perianal and digital rectal examinations were normal.      A 3 mm polyp was found in the ascending colon. The polyp was sessile.       The polyp was removed with a cold snare. Resection and retrieval were       complete.      A 6 mm polyp was found in the sigmoid colon. The polyp was sessile. The       polyp was removed with a cold snare. Resection and retrieval were       complete.      Multiple small-mouthed diverticula were found in the sigmoid colon.      Non-bleeding internal hemorrhoids were found during retroflexion. The       hemorrhoids were Grade II (internal hemorrhoids that prolapse but reduce       spontaneously). Impression:            - One 3 mm polyp in the ascending colon, removed with                         a cold snare. Resected and retrieved.                        - One 6 mm polyp in the sigmoid colon, removed with a                         cold snare. Resected and retrieved.                        - Diverticulosis in the sigmoid colon.                        -  Non-bleeding internal hemorrhoids. Recommendation:        - Discharge patient to home.                        - Resume previous diet.                        - Continue present medications.                        - Await pathology results.                        - Repeat colonoscopy in 7 years for surveillance. Procedure Code(s):     --- Professional ---                        [phone], Colonoscopy, flexible; with removal of                          tumor(s), polyp(s), or other lesion(s) by snare                         technique Diagnosis Code(s):     --- Professional ---                        Z86.010, Personal history of colonic polyps                        D12.2, Benign neoplasm of ascending colon                        D12.5, Benign neoplasm of sigmoid colon CPT copyright 2022 American Medical Association. All rights reserved. The codes documented in this report are preliminary and upon coder review may  be revised to meet current compliance requirements. Midge Minium MD, MD 11/17/2022 11:40:23 AM This report has been signed electronically. Number of Addenda: 0 Note Initiated On: 11/17/2022 11:19 AM Scope Withdrawal Time: 0 hours 7 minutes 16 seconds  Total Procedure Duration: 0 hours 10 minutes 25 seconds  Estimated Blood Loss:  Estimated blood loss: none.      [facility]

## 2022-11-17 NOTE — Transfer of Care (Signed)
Immediate Anesthesia Transfer of Care Note  Patient: Ian Campos  Procedure(s) Performed: COLONOSCOPY WITH PROPOFOL with polypectomy  Patient Location: PACU  Anesthesia Type: No value filed.  Level of Consciousness: awake, alert  and patient cooperative  Airway and Oxygen Therapy: Patient Spontanous Breathing and Patient connected to supplemental oxygen  Post-op Assessment: Post-op Vital signs reviewed, Patient's Cardiovascular Status Stable, Respiratory Function Stable, Patent Airway and No signs of Nausea or vomiting  Post-op Vital Signs: Reviewed and stable  Complications: No notable events documented.

## 2022-11-17 NOTE — H&P (Signed)
Ian Minium, MD [facility] [address] [place], [place] [address] Phone:[phone] Fax : [phone]  Primary Care Physician:  Ethelda Chick, MD Primary Gastroenterologist:  Dr. Servando Snare  Pre-Procedure History & Physical: HPI:  Ian Campos is a 67 y.o. male is here for an colonoscopy.   Past Medical History:  Diagnosis Date   Allergy    Nasonex   Arthritis    neck?   Benign paroxysmal positional vertigo    x1. 5-6 yrs ago   Elevated blood pressure reading without diagnosis of hypertension    Erectile dysfunction    Family history of adverse reaction to anesthesia    Mother - slow to wake   Gilbert's disease    Nonspecific abnormal finding in stool contents    Status post prostatectomy     Past Surgical History:  Procedure Laterality Date   APPENDECTOMY     COLONOSCOPY  04/24/2007   normal.  Willy Vorce.   COLONOSCOPY WITH PROPOFOL N/A 02/24/2017   Procedure: COLONOSCOPY WITH PROPOFOL;  Surgeon: Ian Minium, MD;  Location: [facility];  Service: Endoscopy;  Laterality: N/A;   EYE SURGERY     Lasik   POLYPECTOMY  02/24/2017   Procedure: POLYPECTOMY;  Surgeon: Ian Minium, MD;  Location: [facility];  Service: Endoscopy;;   TREATMENT FISTULA ANAL  06/20/1978   VASECTOMY      Prior to Admission medications   Medication Sig Start Date End Date Taking? Authorizing Provider  Ascorbic Acid (VITAMIN C PO) Take by mouth.   Yes [provider]  ASPIRIN 81 PO Take by mouth daily.   Yes [provider]  atorvastatin (LIPITOR) 10 MG tablet Take by mouth. 07/31/19 11/17/22 Yes [provider]  cetirizine (ZYRTEC) 10 MG tablet Take 10 mg by mouth daily.   Yes [provider]  fish oil-omega-3 fatty acids 1000 MG capsule Take 2 g by mouth daily.   Yes [provider]  Multiple Vitamin (MULTIVITAMIN) tablet Take 1 tablet by mouth daily.   Yes [provider]  tadalafil (CIALIS) 5 MG tablet Take 5 mg by  mouth daily as needed for erectile dysfunction.   Yes [provider]  FLUZONE QUADRIVALENT 0.5 ML injection ADM 0.5ML IM UTD 05/01/17   [provider]    Allergies as of 10/06/2022 - Review Complete 11/22/2019  Allergen Reaction Noted   Other  02/16/2017   Penicillins  09/10/2012    Family History  Problem Relation Age of Onset   Stroke Mother 17   Heart disease Mother 91       CABG age 63   Hypertension Mother    Hyperlipidemia Brother    Hypertension Brother    Heart disease Brother 7       AMI   Heart disease Brother    Hyperlipidemia Brother    Hypertension Brother    COPD Brother    Mental illness Father    Cancer Brother        prostate cancer   Heart disease Maternal Grandmother    Heart disease Maternal Grandfather    Hyperlipidemia Maternal Grandfather     Social History   Socioeconomic History   Marital status: Married    Spouse name: Not on file   Number of children: 1   Years of education: 12   Highest education level: Not on file  Occupational History   Occupation: [employer]    Comment: fulltime x 18 years  Tobacco Use  Smoking status: Former    Packs/day: .5    Types: Cigarettes    Quit date: 1975    Years since quitting: 49.4   Smokeless tobacco: Never  Vaping Use   Vaping Use: Never used  Substance and Sexual Activity   Alcohol use: Yes    Alcohol/week: 24.0 standard drinks of alcohol    Types: 24 Cans of beer per week    Comment:     Drug use: No   Sexual activity: Yes  Other Topics Concern   Not on file  Social History Narrative   Marital status:  Married x 43 years.      Children:  1 child Casimiro Needle), 2 grandchildren.      Lives: with wife.        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  Travels a lot.      Tobacco:  Stopped age 84.      Alcohol: 3-8 beers most nights; more on weekends.  No DWIs.      Drugs:  None      Exercise:  none      Seatbelt:  100%.      Sunscreen:  SPF 15.        Guns:  Several; locked/secured.        Caffeine use: Consumes moderate amount daily.      Smoke alarm in the home.      Social Determinants of Health   Financial Resource Strain: Not on file  Food Insecurity: Not on file  Transportation Needs: Not on file  Physical Activity: Not on file  Stress: Not on file  Social Connections: Not on file  Intimate Partner Violence: Not on file    Review of Systems: See HPI, otherwise negative ROS  Physical Exam: BP (!) 155/92   Pulse 71   Temp 97.8 F (36.6 C) (Temporal)   Resp 18   Ht 6\' 6"  (1.981 m)   Wt 100.7 kg   SpO2 99%   BMI 25.65 kg/m  General:   Alert,  pleasant and cooperative in NAD Head:  Normocephalic and atraumatic. Neck:  Supple; no masses or thyromegaly. Lungs:  Clear throughout to auscultation.    Heart:  Regular rate and rhythm. Abdomen:  Soft, nontender and nondistended. Normal bowel sounds, without guarding, and without rebound.   Neurologic:  Alert and  oriented x4;  grossly normal neurologically.  Impression/Plan: Ian Campos is here for an colonoscopy to be performed for a history of adenomatous polyps on 2018   Risks, benefits, limitations, and alternatives regarding  colonoscopy have been reviewed with the patient.  Questions have been answered.  All parties agreeable.   Ian Minium, MD  11/17/2022, 11:20 AM

## 2022-11-17 NOTE — Anesthesia Postprocedure Evaluation (Signed)
Anesthesia Post Note  Patient: Ian Campos  Procedure(s) Performed: COLONOSCOPY WITH PROPOFOL with polypectomy  Patient location during evaluation: PACU Anesthesia Type: General Level of consciousness: awake and alert Pain management: pain level controlled Vital Signs Assessment: post-procedure vital signs reviewed and stable Respiratory status: spontaneous breathing, nonlabored ventilation, respiratory function stable and patient connected to nasal cannula oxygen Cardiovascular status: blood pressure returned to baseline and stable Postop Assessment: no apparent nausea or vomiting Anesthetic complications: no   No notable events documented.   Last Vitals:  Vitals:   11/17/22 1145 11/17/22 1154  BP: 113/70 136/75  Pulse: 70 72  Resp: 17 12  Temp:    SpO2: 94% 97%    Last Pain:  Vitals:   11/17/22 1154  TempSrc:   PainSc: 0-No pain                 Oluwafemi Villella C Carmel Waddington

## 2022-11-18 ENCOUNTER — Encounter: Payer: Self-pay | Admitting: Gastroenterology

## 2022-11-25 LAB — ANATOMIC PATHOLOGY REPORT

## 2022-11-28 ENCOUNTER — Encounter: Payer: Self-pay | Admitting: Gastroenterology
